# Patient Record
Sex: Male | Born: 1935 | Race: White | Hispanic: No | Marital: Married | Smoking: Never smoker
Health system: Southern US, Community
[De-identification: ages and names within clinical notes are randomized; demographics above are authoritative.]

## PROBLEM LIST (undated history)

## (undated) DIAGNOSIS — E785 Hyperlipidemia, unspecified: Secondary | ICD-10-CM

## (undated) DIAGNOSIS — Z87448 Personal history of other diseases of urinary system: Secondary | ICD-10-CM

## (undated) DIAGNOSIS — C4490 Unspecified malignant neoplasm of skin, unspecified: Secondary | ICD-10-CM

## (undated) DIAGNOSIS — Z87442 Personal history of urinary calculi: Secondary | ICD-10-CM

## (undated) DIAGNOSIS — R42 Dizziness and giddiness: Secondary | ICD-10-CM

## (undated) DIAGNOSIS — I10 Essential (primary) hypertension: Secondary | ICD-10-CM

## (undated) DIAGNOSIS — Z973 Presence of spectacles and contact lenses: Secondary | ICD-10-CM

## (undated) DIAGNOSIS — M47814 Spondylosis without myelopathy or radiculopathy, thoracic region: Secondary | ICD-10-CM

## (undated) DIAGNOSIS — C61 Malignant neoplasm of prostate: Secondary | ICD-10-CM

## (undated) HISTORY — PX: OTHER SURGICAL HISTORY: SHX169

## (undated) HISTORY — DX: Unspecified malignant neoplasm of skin, unspecified: C44.90

---

## 1999-09-27 ENCOUNTER — Ambulatory Visit (HOSPITAL_COMMUNITY): Admission: RE | Admit: 1999-09-27 | Discharge: 1999-09-27 | Payer: Self-pay | Admitting: Urology

## 1999-09-27 ENCOUNTER — Encounter: Payer: Self-pay | Admitting: Urology

## 1999-10-04 ENCOUNTER — Ambulatory Visit (HOSPITAL_COMMUNITY): Admission: RE | Admit: 1999-10-04 | Discharge: 1999-10-04 | Payer: Self-pay | Admitting: Urology

## 1999-10-04 ENCOUNTER — Encounter: Payer: Self-pay | Admitting: Urology

## 1999-10-04 HISTORY — PX: OTHER SURGICAL HISTORY: SHX169

## 2000-02-01 ENCOUNTER — Encounter: Payer: Self-pay | Admitting: *Deleted

## 2000-02-01 ENCOUNTER — Encounter: Admission: RE | Admit: 2000-02-01 | Discharge: 2000-02-01 | Payer: Self-pay | Admitting: *Deleted

## 2001-01-12 ENCOUNTER — Encounter: Payer: Self-pay | Admitting: Urology

## 2001-01-12 ENCOUNTER — Encounter: Payer: Self-pay | Admitting: Emergency Medicine

## 2001-01-12 ENCOUNTER — Ambulatory Visit (HOSPITAL_COMMUNITY): Admission: RE | Admit: 2001-01-12 | Discharge: 2001-01-12 | Payer: Self-pay | Admitting: Urology

## 2001-01-18 ENCOUNTER — Encounter: Payer: Self-pay | Admitting: *Deleted

## 2001-01-18 ENCOUNTER — Ambulatory Visit (HOSPITAL_COMMUNITY): Admission: RE | Admit: 2001-01-18 | Discharge: 2001-01-18 | Payer: Self-pay | Admitting: Urology

## 2001-01-18 HISTORY — PX: OTHER SURGICAL HISTORY: SHX169

## 2001-10-13 ENCOUNTER — Ambulatory Visit (HOSPITAL_COMMUNITY): Admission: RE | Admit: 2001-10-13 | Discharge: 2001-10-13 | Payer: Self-pay | Admitting: Urology

## 2007-04-10 ENCOUNTER — Ambulatory Visit (HOSPITAL_BASED_OUTPATIENT_CLINIC_OR_DEPARTMENT_OTHER): Admission: RE | Admit: 2007-04-10 | Discharge: 2007-04-10 | Payer: Self-pay | Admitting: Urology

## 2007-09-21 ENCOUNTER — Encounter: Admission: RE | Admit: 2007-09-21 | Discharge: 2007-09-21 | Payer: Self-pay | Admitting: Internal Medicine

## 2007-10-28 ENCOUNTER — Ambulatory Visit (HOSPITAL_BASED_OUTPATIENT_CLINIC_OR_DEPARTMENT_OTHER): Admission: RE | Admit: 2007-10-28 | Discharge: 2007-10-28 | Payer: Self-pay | Admitting: Urology

## 2007-10-28 HISTORY — PX: OTHER SURGICAL HISTORY: SHX169

## 2009-01-24 IMAGING — CR DG ABDOMEN 1V
1 series · 1 of 1 positions shown · non-contrast
Comparison: 04/10/2007

CLINICAL DATA: Distal right ureteral stone.  Preop.

ABDOMEN - 1 VIEW

[t abdomen supine]
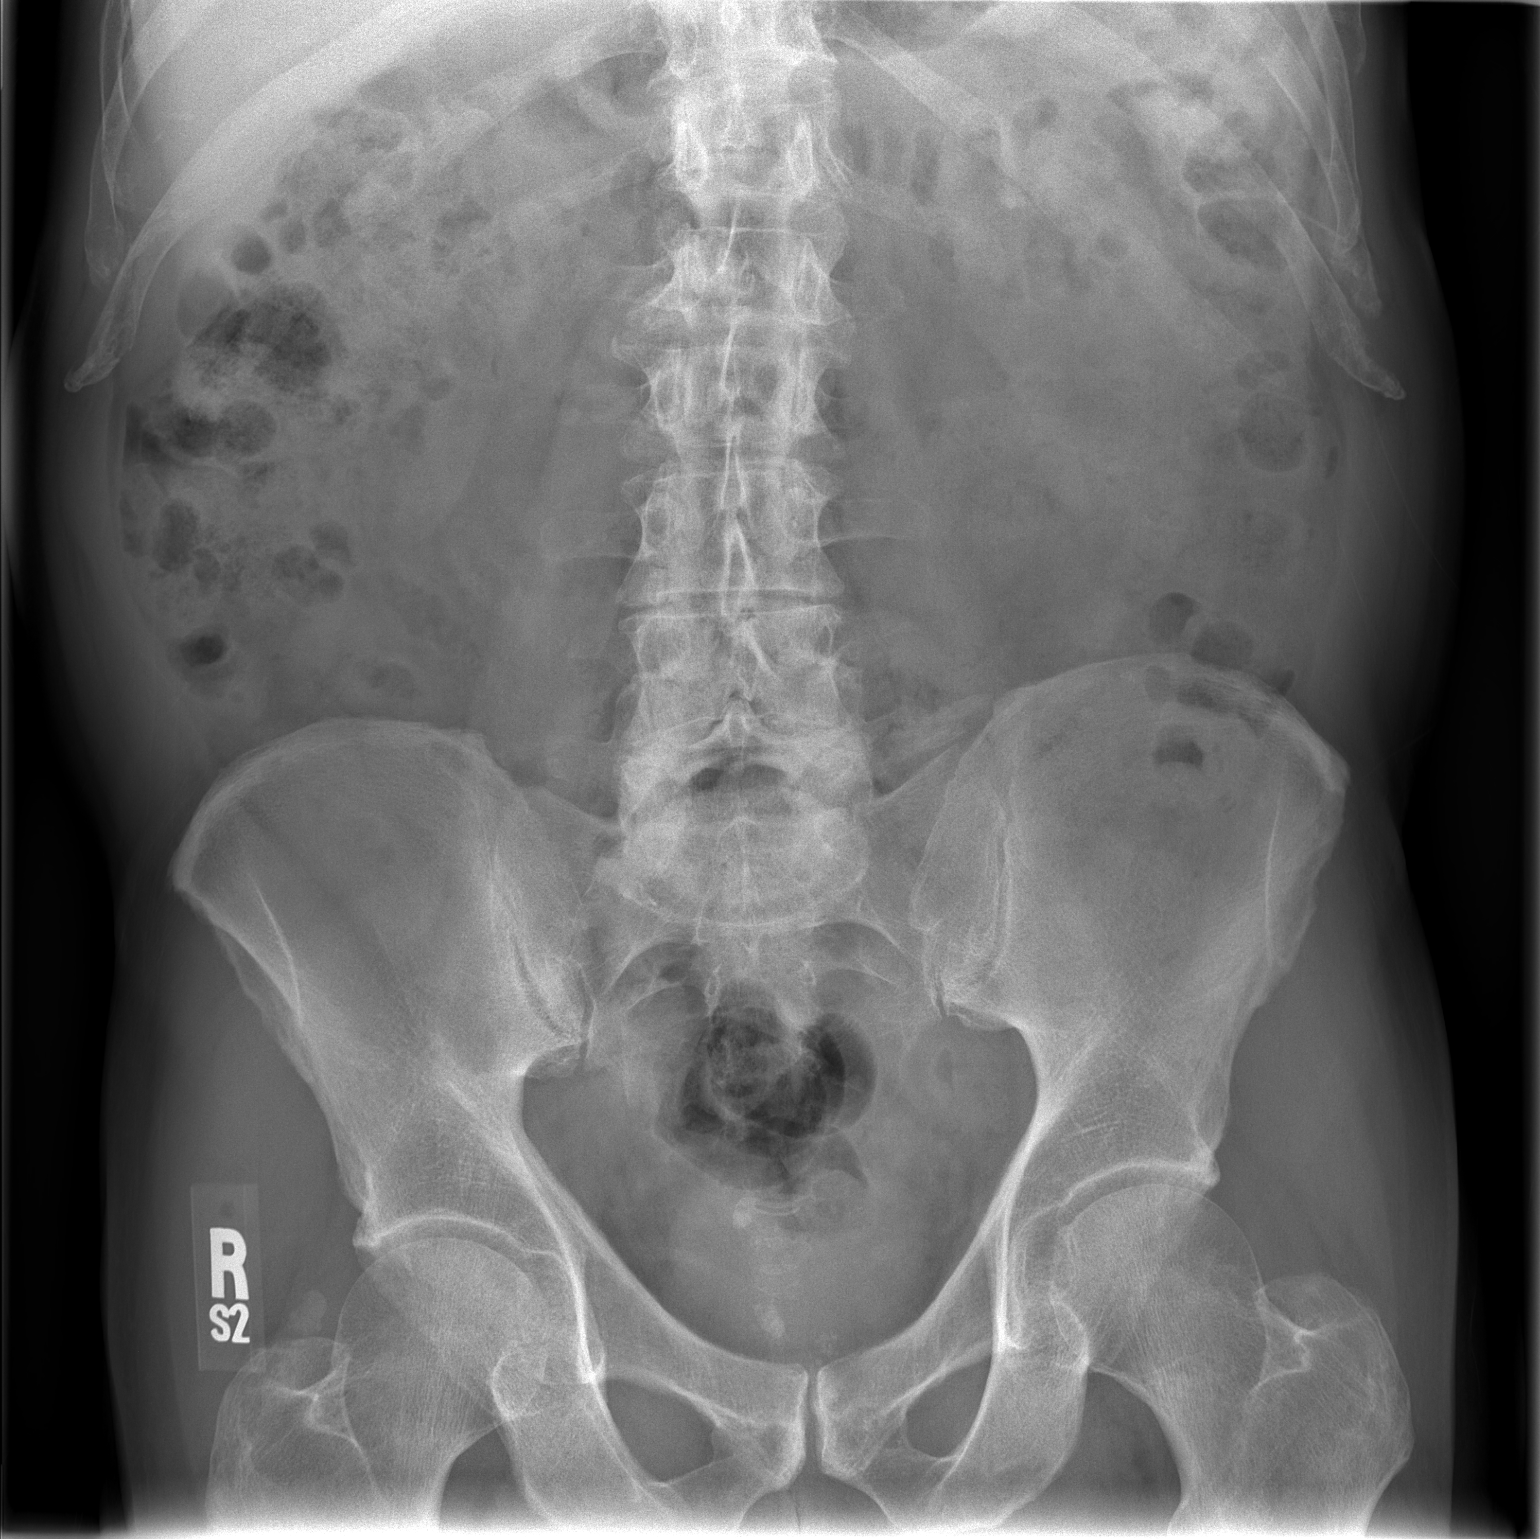

[1 of 1 positions shown; findings below may reference images not displayed]

FINDINGS: Calcifications project in the midline in the lower
pelvis, possibly vascular or prostatic.  No definite ureteral
calculi seen.  Previously noted calcifications over the kidneys not
as well visualized with overlying stool and bowel gas.  A
nonobstructive bowel gas pattern.
IMPRESSION: No definite ureteral calculi.  Calcifications near the midline of
the pelvis presumably are vascular or prostatic.  Recommend
clinical correlation.

## 2010-07-31 NOTE — Op Note (Signed)
Colin Lewis, Colin Lewis                   ACCOUNT NO.:  [id number]   MEDICAL RECORD NO.:  [id number]          PATIENT TYPE:  AMB   LOCATION:  [facility]                         FACILITY:  [facility]   PHYSICIAN:  Ronald L. Earlene Plater, M.D.  DATE OF BIRTH:  Jul 09, 1932   DATE OF PROCEDURE:  04/10/2007  DATE OF DISCHARGE:                               OPERATIVE REPORT   DIAGNOSIS:  Left ureterolithiasis, persistent.   OPERATIVE PROCEDURES:  1. Cystourethroscopy.  2. Left ureteroscopy with holmium laser lithotripsy.  3. Basket stone extraction.  4. Placement of left double-J stent.   SURGEON:  Lucrezia Starch. Earlene Plater, M.D.   ANESTHESIA:  LMA.   BLOOD LOSS:  Negligible.   TUBES:  26 cm 7-French contour double pigtail stent.   COMPLICATIONS:  None.   INDICATION FOR PROCEDURE:  Colin Lewis is a very nice 77 year old white male  who presented with left flank pain and some left groin pain, some nausea  and vomiting.  He was subsequently on CT found to have an approximately  5-6 mm left midureteral stone.  He has been firmed and most recently his  last pain with approximately 2 weeks ago but the stone has been  persistent and after understanding risks, benefits and alternatives, he  has elected to proceed with removal.   PROCEDURE IN DETAIL:  The patient was placed in supine position.  After  proper LMA anesthesia, he was placed in the dorsal lithotomy position  and prepped and draped with Betadine in a sterile fashion.  Cystourethroscopy was performed with a 22.5-French Olympus panendoscope.  There was noted be mild trilobar hypertrophy.  The bladder was smooth-  walled and efflux of urine was noted from the right ureteral orifice and  the left ureteral orifice with normal location.  No other lesions were  noted.  Both the 12 and 70-degree lenses were utilized.  Under  fluoroscopic guidance a 0.038-French sensor wire was placed in the left  renal pelvis past the stone, which was located at the pelvic brim, and  the distal ureter was dilated with the inner dilating sheath of the  ureteral access sheath.  Ureteroscopy was then performed with the short  thin Wolf ureteroscope, semi-flexible.  The stone was visualized and  utilizing the 365-micron holmium laser fiber on a setting of 0.5 and  repetition rate of 5, the stone was broken into multiple small fragments  and each fragment was grasped and removed with a nitinol basket.  Reinspection of the lower two-thirds ureter revealed there were no  significant fragments remaining and no perforations.  The ureteroscope  was visually removed and then under fluoroscopic guidance, a 26-cm 7-  [place] contour double pigtail stent was placed without a string and  noted be in good position within the left renal pelvis and within the  bladder.  The bladder was drained.  There was a very small bleeder on  the 6 o'clock position of the  prostatic urethra.  This was Bovie cauterized with the Bugbee  coagulation cautery.  No other lesions were noted to be present.  The  stent  was noted to be in good location within the bladder and within the  renal pelvis.  The bladder was drained, the panendoscope was removed.  The stones will be given to patient and submitted for stone analysis.      Ronald L. Earlene Plater, M.D.  Electronically Signed     RLD/MEDQ  D:  04/10/2007  T:  04/10/2007  Job:  [id number]

## 2010-07-31 NOTE — Op Note (Signed)
NAMESABIR, CHARTERS                   ACCOUNT NO.:  [id number]   MEDICAL RECORD NO.:  [id number]          PATIENT TYPE:  AMB   LOCATION:  [facility]                         FACILITY:  [facility]   PHYSICIAN:  Ronald L. Earlene Plater, M.D.  DATE OF BIRTH:  Mar 01, 1933   DATE OF PROCEDURE:  10/28/2007  DATE OF DISCHARGE:                               OPERATIVE REPORT   PREOPERATIVE DIAGNOSIS:  Right ureterolithiasis with hydronephrosis.   OPERATIVE PROCEDURE:  Cystourethroscopy, right ureteroscopy, Holmium  laser lithotripsy and placement of right double-J stent and stone basket  extraction.   SURGEON:  Laurin Coder, MD   ANESTHESIA:  LMA.   BLOOD LOSS:  Negligible.   TUBES:  24 cm 7 [place] contoured double pigtail stent without a string.   COMPLICATIONS:  None.   INDICATIONS FOR PROCEDURE:  Mr. Preast is a very nice 77 year old white  male who presented in mid July with right flank pain, nausea and  vomiting.  On KUB he was subsequently found to have a large right distal  ureteral stone.  He is averted, although he has not had much pain he has  had occasional pain episodes and the stone has really not progressed.  KUB today reveals that it is still present in the distal ureter.  After  understanding risks, benefits and alternatives he has elected to proceed  with the above procedure.   PROCEDURE IN DETAIL:  The patient was placed in supine position and  after proper LMA anesthesia was placed in the dorsal lithotomy position  and prepped and draped with Betadine in sterile fashion.  Cystourethroscopy was performed with a 22.5 French Olympus panendoscope  utilizing 12 and 70 degree lenses.  The bladder was carefully inspected.  He had moderate trilobar hypertrophy and grade 1 trabeculation of the  bladder.  The left ureteral orifice was normal in location.  Efflux of  clear urine was noted from that.  However, the right ureteral orifice  was quite inflamed, swollen and it appeared that the stone was  impacted  in the intramural ureter.  Under direct vision a 0.038 French sensor  wire was placed past the stone into the right renal pelvis.  The distal  ureter was dilated with the inner dilating sheath of a ureteral access  catheter and ureteroscopy was performed with a short thin semi flexible  ureteroscope.  The stone was visualized and noted to be quite large and  smooth and utilizing the 365 micron laser fiber on a setting of 0.5 and  a repetition rate of 5 the stone was serially fragmented into multiple  small fragments and each fragment was grasped with a Nitinol basket and  removed.  Reinspection of the lower third ureter revealed there were no  perforations.  No significant fragments.  There was significant  hydronephrosis noted above where the stone had been impacted.  The  fragments will be submitted for stone analysis and under fluoroscopic  guidance a 24 cm 7  French contoured double pigtail stent was placed and noted to be in good  position within the right renal pelvis  and within the bladder.  The  bladder was drained, the panendoscope was removed.  Stent localization  was confirmed fluoroscopically and the patient was taken to the recovery  room stable.      Ronald L. Earlene Plater, M.D.  Electronically Signed     RLD/MEDQ  D:  10/28/2007  T:  10/28/2007  Job:  [id number]

## 2010-08-03 NOTE — Op Note (Signed)
Colin, Lewis                            ACCOUNT NO.:  [id number]   MEDICAL RECORD NO.:  [id number]                   PATIENT TYPE:  AMB   LOCATION:  DAY                                  FACILITY:  [facility]   PHYSICIAN:  Ronald L. Earlene Plater, M.D.               DATE OF BIRTH:  [birth year]   DATE OF PROCEDURE:  10/13/2001  DATE OF DISCHARGE:                                 OPERATIVE REPORT   PREOPERATIVE DIAGNOSES:  Left ureteral calculus.   POSTOPERATIVE DIAGNOSES:  Left ureteral calculus.   PROCEDURE:  Cystoscopy.  Left ureteroscopic stone extraction.   SURGEON:  Lucrezia Starch. Earlene Plater, M.D.   ASSISTANT:  Heloise Purpura, M.D.   ANESTHESIA:  General.   COMPLICATIONS:  None.   INDICATIONS FOR PROCEDURE:  Colin Lewis is a 77 year old white male with  approximately a three-month history of a known left ureteral calculus.  After conservative management, attempt at trying to catch the stone, he has  been unable to do so.  He has continued to have some intermittent flank pain  with partial obstruction due to this calculus.  After discussing options,  the patient elected to proceed with ureteroscopic stone removal.  Potential  risks and benefits of this procedure were explained to the patient and he  consented.   DESCRIPTION OF PROCEDURE:  The patient was taken to the operating room and a  general anesthesia was administered.  The patient was placed in the dorsal  lithotomy position, administered preoperative antibiotics, and prepped and  draped in the usual sterile fashion.  Next, standard cystourethroscopy was  performed with the 12 and 70 degree lens.  This revealed a normal anterior  urethra.  The posterior urethra demonstrated a high bladder neck with a  mildly enlarged middle lobe of the prostate.  Examination of the bladder  revealed the ureteral orifices to be in their normal anatomic position  bilaterally and effluxing clear urine.  Inspection of the remainder of the  bladder revealed  no evidence of any tumors, stone, diverticula, or other  mucosal pathology.  Attention was then turned to the left ureteral orifice.  A 0.038 guide wire was passed up the left ureteral orifice into the left  renal pelvis under fluoroscopic guidance without difficulty.  The cystoscope  was then removed and replaced with the short, semirigid ureteroscope.  The  left ureteral orifice was able to be cannulated without difficulty and the  ureteroscope was passed up into the left midureter where a calculus was  identified.  This appeared to be small enough to be removed and therefore a  Nitinol basket was placed through the scope and the stone was able to be  grasped and removed from the patient.  Reinspection of the ureter revealed  no evidence of any further calculi or damage to the ureter.  Of note, the  stone was partially imbedded in the ureteral mucosa.  The cystoscope was  then replaced in the bladder and the  bladder was drained.  The patient appeared to tolerate this procedure well  and without complications.  He was able to be awakened and transferred to  the recovery room in satisfactory condition.  Please note that Dr. Earlene Plater was  present and participated in this entire procedure.     Heloise Purpura, M.D.                       Ronald L. Earlene Plater, M.D.    Elige Ko  D:  10/13/2001  T:  10/15/2001  Job:  [id number]

## 2010-08-03 NOTE — Op Note (Signed)
[place]  Patient:    Colin Lewis, Colin Lewis                          MRN: [id number] Proc. Date: 10/04/99 Adm. Date:  [id number] Disc. Date: [id number] Attending:  Nelma Rothman Iii                           Operative Report  PREOPERATIVE DIAGNOSES: 1.  Right ureterolithiasis with obstruction. 2.  Meatal stenosis.  POSTOPERATIVE DIAGNOSES: 1.  Right ureterolithiasis with obstruction. 2.  Meatal stenosis.  OPERATIVE PROCEDURE:  Dilatation of meatal stenosis with Sissy Hoff sounds, cystourethroscopy, left retrograde ureteral pyelogram, left ureteroscopy with Candella laser lithotripsy and stone basket extraction and placement of left double J stent.  SURGEON:  Lucrezia Starch. Ovidio Hanger, M.D.  ANESTHESIA:  General, laryngeal airways.  BLOOD LOSS:  Negligible.  TUBES:  26 cm 6 French Bard double pigtail stent.  COMPLICATIONS:  None.  INDICATIONS FOR PROCEDURE:  Mr. Roethler is a very nice 77 year old white male who presented with left flank pain, nausea and vomiting.  IVP revealed what appeared to be a 7 mm left upper ureteral calculus which subsequently moved to the left lower ureter tube.  He has had intermittent pain.  After understanding risks, benefits and alternatives, it was elected to proceed with the above procedure.  PROCEDURE IN DETAIL:  The patient was placed in supine position.  After proper general laryngeal airway anesthesia, he was placed in the dorsal lithotomy position, prepped and draped with Betadine in sterile fashion.  The meatus was noted to be quite tight and was dilated to 24 [place] with [employer] sounds, and cystourethroscopy was performed with a 22.5 [employer]. Utilizing 12 and 70 degree lenses, the bladder was carefully inspected and noted to have moderate trilobar enlargement of the prostate with grade 1 trabeculation.  Efflux of urine was noted from the right ureteral orifice but very little from the  left.  Under fluoroscopic guidance a .038 French Teflon coated guide wire was placed in the left renal pelvis and the lower ureter was balloon dilated with a 4 cm, 5 mm balloon dilator to 10 atmospheres of pressure for three minutes.  It was noted to have a tight ureteral orifice and it was felt that manipulation with the scope through that was probably not indicated.  The balloon was then removed and dye was injected and the stone was visualized and there was noted to be mild obstruction.  Utilizing the 6 French [employer] semi-rigid ureteroscope, the stone was visualized and broken into fragments with the Candella laser.  Each fragment was serially basketed with a Segura basket and will be submitted for stone analysis. Reinspection revealed no significant fragments remaining, and again, dye was injected and there were no perforations noted to be present.  The ureteroscope was visually removed. Under fluoroscopic guidance a 26 cm 6 French Bard double pigtail stent was placed with a pullout string and noted to be in good position within the bladder and within the left renal pelvis.  The bladder was drained.  The panendoscope was removed.  The string was taped to the penis and the patient was taken to the recovery room stable. DD:  10/04/99 TD:  10/06/99 Job: [id number]

## 2010-08-03 NOTE — Op Note (Signed)
[facility]  Patient:    Colin Lewis, Colin Lewis Visit Number: [id number] MRN: [id number]          Service Type: DSU Location: DAY Attending Physician:  Katherine Roan Proc. Date: 01/18/01 Admit Date:  01/18/2001   CC:         Windy Fast L. Ovidio Hanger, M.D.   Operative Report  PREOPERATIVE DIAGNOSIS:  Right proximal ureteral stone with obstruction.  POSTOPERATIVE DIAGNOSIS:  Right proximal ureteral stone with obstruction with mild urinary extravasation same area.  PROCEDURE:  Cystoscopy, right retrograde pyelogram, insertion of right ureteral stent.  ANESTHESIA:  General.  SURGEON:  Rozanna Boer., M.D.  BRIEF HISTORY:  This 77 year old patient underwent right lithotripsy on 01/12/01, of a proximal right ureteral stone.  He had a lot of pain and went to the office on 01/16/01 for a shot of Toradol and had to go back to the emergency room again for the same.  CT scan showed a 7 mm stone in the proximal right ureter with high-grade hydronephrosis, and he is admitted now for cystoscopy stent placement.  The only other surgery he had was ureteroscopy 07/01, which was successful.  He takes only pain medicine.  He just finished his Cipro today.  DESCRIPTION OF PROCEDURE:  The patient was placed on the operating room table in the dorsal lithotomy position after satisfactory induction of general endotracheal anesthesia, was prepped and draped with Betadine in the usual sterile fashion.  He was given Ancef and gentamicin IV preoperatively.  No anterior stricture seen.  Some mild hyperplasia of the prostate noted.  The bladder was entered, and the right ureteral orifice was somewhat small, and I had to use a guidewire through the open-ended catheter to be able to catheterize the orifice.  Once this was done, a retrograde was done which demonstrated a proximal filling defect in the region of the previous stone with moderate hydronephrosis and  mild urinary extravasation.  I was able to pass the catheter up to the stone but could not get the guidewire passed, and I then had to use a Glidewire to be able to negotiate beyond the stone and then passed the open-ended catheter beyond that into the renal pelvis.  He had a hydronephrotic drip of slightly bloody urine obtained with this maneuver. Through the open-ended catheter, I then passed the floppy tip guidewire and then removed the open-ended catheter and then over the guidewire passed a 24 cm 6 French ureteral stent without difficulty in good position.  The slightly bloody urine could be seen flowing through the end of the stent when it was in the bladder.  The bladder was then drained, the scope removed, and the patient taken to the recovery room in good condition.  He will be later discharged as an outpatient.  He will be maintained on his Cipro, given some more pain medicine and will follow up with Dr. Earlene Plater next week as scheduled. Attending Physician:  Katherine Roan DD:  01/18/01 TD:  01/19/01 Job: [id number]

## 2010-12-06 LAB — CBC
RBC: 4.6
WBC: 8.4

## 2010-12-06 LAB — URINALYSIS, ROUTINE W REFLEX MICROSCOPIC
Glucose, UA: NEGATIVE
Protein, ur: NEGATIVE
Urobilinogen, UA: 1

## 2010-12-06 LAB — DIFFERENTIAL
Lymphocytes Relative: 23
Lymphs Abs: 1.9
Monocytes Relative: 9
Neutro Abs: 4.1
Neutrophils Relative %: 49

## 2010-12-06 LAB — BASIC METABOLIC PANEL
Calcium: 8.8
Chloride: 103
Creatinine, Ser: 1.55 — ABNORMAL HIGH
GFR calc Af Amer: 54 — ABNORMAL LOW

## 2010-12-14 LAB — BASIC METABOLIC PANEL
CO2: 27
Glucose, Bld: 102 — ABNORMAL HIGH
Potassium: 4.2
Sodium: 141

## 2010-12-14 LAB — CBC
HCT: 39.8
Hemoglobin: 13.1
MCHC: 32.9
RDW: 13.4

## 2011-02-21 ENCOUNTER — Encounter (INDEPENDENT_AMBULATORY_CARE_PROVIDER_SITE_OTHER): Payer: Medicare Other | Admitting: Ophthalmology

## 2011-02-21 DIAGNOSIS — D3130 Benign neoplasm of unspecified choroid: Secondary | ICD-10-CM

## 2011-02-21 DIAGNOSIS — H43819 Vitreous degeneration, unspecified eye: Secondary | ICD-10-CM

## 2011-02-21 DIAGNOSIS — H2510 Age-related nuclear cataract, unspecified eye: Secondary | ICD-10-CM

## 2011-03-19 HISTORY — PX: CATARACT EXTRACTION W/ INTRAOCULAR LENS  IMPLANT, BILATERAL: SHX1307

## 2012-02-20 ENCOUNTER — Ambulatory Visit
Admission: RE | Admit: 2012-02-20 | Discharge: 2012-02-20 | Disposition: A | Discharge status: Home / Self Care | Payer: Medicare Other | Source: Ambulatory Visit | Attending: Internal Medicine | Admitting: Internal Medicine

## 2012-02-20 ENCOUNTER — Ambulatory Visit
Admission: RE | Admit: 2012-02-20 | Discharge: 2012-02-20 | Disposition: A | Discharge status: Home / Self Care | Payer: 59 | Source: Ambulatory Visit | Attending: Internal Medicine | Admitting: Internal Medicine

## 2012-02-20 ENCOUNTER — Other Ambulatory Visit: Payer: Self-pay | Admitting: Internal Medicine

## 2012-02-20 DIAGNOSIS — R109 Unspecified abdominal pain: Secondary | ICD-10-CM

## 2012-02-20 DIAGNOSIS — R079 Chest pain, unspecified: Secondary | ICD-10-CM

## 2012-02-24 ENCOUNTER — Ambulatory Visit (INDEPENDENT_AMBULATORY_CARE_PROVIDER_SITE_OTHER): Payer: Medicare Other | Admitting: Ophthalmology

## 2012-02-24 DIAGNOSIS — H43819 Vitreous degeneration, unspecified eye: Secondary | ICD-10-CM

## 2012-02-24 DIAGNOSIS — D3130 Benign neoplasm of unspecified choroid: Secondary | ICD-10-CM

## 2012-02-24 DIAGNOSIS — H2510 Age-related nuclear cataract, unspecified eye: Secondary | ICD-10-CM

## 2012-05-07 ENCOUNTER — Other Ambulatory Visit: Payer: Self-pay | Admitting: Urology

## 2012-05-07 NOTE — Pre-Procedure Instructions (Signed)
Asked to bring blue folder the day of the procedure,insurance card,I.D. driver's license,wear comfortable clothing and have a driver for the day. Asked not to take Advil,Motrin,Ibuprofen,Aleve or any NSAIDS, Aspirin, or Toradol for 72 hours prior to procedure,  No vitamins or herbal medications 7 days prior to procedure. Will stop Asprin per policy. Instructed to take laxative per doctor's office instructions and eat a light dinner the evening before procedure.   To arrive at 0800  for lithotripsy procedure.

## 2012-05-14 ENCOUNTER — Encounter (HOSPITAL_COMMUNITY): Payer: Self-pay | Admitting: *Deleted

## 2012-05-15 ENCOUNTER — Encounter (HOSPITAL_COMMUNITY): Payer: Self-pay | Admitting: Pharmacy Technician

## 2012-05-18 ENCOUNTER — Encounter (HOSPITAL_COMMUNITY): Payer: Self-pay | Admitting: General Practice

## 2012-05-18 ENCOUNTER — Ambulatory Visit (HOSPITAL_COMMUNITY)
Admission: RE | Admit: 2012-05-18 | Discharge: 2012-05-18 | Disposition: A | Discharge status: Home / Self Care | Payer: Medicare Other | Source: Ambulatory Visit | Attending: Urology | Admitting: Urology

## 2012-05-18 ENCOUNTER — Encounter (HOSPITAL_COMMUNITY)
Admission: RE | Disposition: A | Discharge status: Home / Self Care | Payer: Self-pay | Source: Ambulatory Visit | Attending: Urology

## 2012-05-18 ENCOUNTER — Ambulatory Visit (HOSPITAL_COMMUNITY): Payer: Medicare Other

## 2012-05-18 DIAGNOSIS — I10 Essential (primary) hypertension: Secondary | ICD-10-CM | POA: Insufficient documentation

## 2012-05-18 DIAGNOSIS — N200 Calculus of kidney: Secondary | ICD-10-CM | POA: Insufficient documentation

## 2012-05-18 DIAGNOSIS — Z79899 Other long term (current) drug therapy: Secondary | ICD-10-CM | POA: Insufficient documentation

## 2012-05-18 DIAGNOSIS — N320 Bladder-neck obstruction: Secondary | ICD-10-CM | POA: Insufficient documentation

## 2012-05-18 DIAGNOSIS — R972 Elevated prostate specific antigen [PSA]: Secondary | ICD-10-CM | POA: Insufficient documentation

## 2012-05-18 DIAGNOSIS — E7800 Pure hypercholesterolemia, unspecified: Secondary | ICD-10-CM | POA: Insufficient documentation

## 2012-05-18 HISTORY — PX: EXTRACORPOREAL SHOCK WAVE LITHOTRIPSY: SHX1557

## 2012-05-18 HISTORY — DX: Essential (primary) hypertension: I10

## 2012-05-18 SURGERY — LITHOTRIPSY, ESWL
Anesthesia: LOCAL | Laterality: Left

## 2012-05-18 MED ORDER — DIAZEPAM 5 MG PO TABS
10.0000 mg | ORAL_TABLET | ORAL | Status: AC
  Administered 2012-05-18: 10 mg via ORAL
  Filled 2012-05-18: qty 2

## 2012-05-18 MED ORDER — CIPROFLOXACIN HCL 500 MG PO TABS
500.0000 mg | ORAL_TABLET | ORAL | Status: AC
  Administered 2012-05-18: 500 mg via ORAL
  Filled 2012-05-18: qty 1

## 2012-05-18 MED ORDER — DIPHENHYDRAMINE HCL 25 MG PO CAPS
25.0000 mg | ORAL_CAPSULE | ORAL | Status: AC
  Administered 2012-05-18: 25 mg via ORAL
  Filled 2012-05-18: qty 1

## 2012-05-18 MED ORDER — DEXTROSE-NACL 5-0.45 % IV SOLN
INTRAVENOUS | Status: DC
  Administered 2012-05-18: 09:00:00 via INTRAVENOUS

## 2012-05-18 NOTE — H&P (Signed)
Chief Complaint  cc:  Ralene Ok, MD   Reason For Visit  Kidney stone & elevated PSA   Active Problems Problems  1. 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 Nephrolithiasis Of The Left Kidney 592.0 3. Prostate Hard Area Or Nodule On The Left 4. PSA,Elevated 790.93  History of Present Illness           79 yo male, last seen 11/03/07, referred back by Dr. Ludwig Clarks for further evaluation of an elevated PSA and a Lt renal stone. No family hx of prostate cancer. He is retired Psychiatrist. IPSS=13/7.   02/20/12  KUB - 1cm Lt upper pole renal stone   Past Medical History Problems  1. History of  Abdominal Pain In The Left Lower Belly (LLQ) 789.04 2. History of  Constipation 564.00 3. History of  Flank Pain Right 4. History of  Hydronephrosis On The Left 591 5. History of  Hypercholesterolemia 272.0 6. History of  Nausea (Symptom) 787.02 7. History of  Nephrolithiasis V13.01 8. History of  Ureteral Stone Left 592.1 9. History of  Ureteral Stone 592.1  Surgical History Problems  1. History of  Cystoscopy With Insertion Of Ureteral Stent Left 2. History of  Cystoscopy With Insertion Of Ureteral Stent Right 3. History of  Cystoscopy With Ureteroscopy With Lithotripsy 4. History of  Cystoscopy With Ureteroscopy With Lithotripsy  Current Meds 1. Flomax 0.4 MG CP24; TAKE 1 CAPSULE Bedtime; Therapy: 17Jul2009 to (Evaluate:21Sep2009);  Last Rx:23Jul2009 2. Hydrocodone-Acetaminophen 10-650 MG Oral Tablet; TAKE 1 TO 2 TABLETS EVERY 4 TO 6  HOURS AS NEEDED FOR PAIN; Therapy: 15Jul2009 to (Evaluate:19Jul2009); Last Rx:15Jul2009 3. Promethazine HCl 12.5 MG Oral Tablet; TAKE 1 TABLET EVERY 6 HOURS AS NEEDED FOR  NAUSEA; Therapy: 15Jul2009 to (Evaluate:20Jul2009); Last Rx:15Jul2009 4. Simvastatin 80 MG Oral Tablet; Therapy: (Recorded:05Jan2009) to  Allergies Medication  1. No Known Drug Allergies  Family History Problems  1. Paternal history of  Death In The Family Father age 67;  infection 2. Maternal history of  Death In The Family Mother age 68; rare blood disease 3. Family history of  Family Health Status Number Of Children 2 sons; 1 daughter  Social History Problems    Caffeine Use 2 a day   Marital History - Currently Married   Occupation: retired Denied    Alcohol Use   Tobacco Use  Review of Systems Genitourinary, constitutional, skin, eye, otolaryngeal, hematologic/lymphatic, cardiovascular, pulmonary, endocrine, musculoskeletal, gastrointestinal, neurological and psychiatric system(s) were reviewed and pertinent findings if present are noted.  Genitourinary: urinary frequency, feelings of urinary urgency, nocturia, difficulty starting the urinary stream, weak urinary stream, urinary stream starts and stops, incomplete emptying of bladder and initiating urination requires straining, but no dysuria, no incontinence, no post-void dribbling, no hematuria, no urethral discharge, no erectile dysfunction and no penile pain.  Gastrointestinal: no diarrhea and no constipation.  ENT: sinus problems.  Musculoskeletal: back pain and joint pain.    Vitals Vital Signs [Data Includes: Last 1 Day]  22Jan2014 11:16AM  BMI Calculated: 27.66 BSA Calculated: 1.83 Height: 5 ft 5 in Weight: 166 lb  Blood Pressure: 164 / 80 Temperature: 97.8 F Heart Rate: 66  Physical Exam Constitutional: Well nourished and well developed . No acute distress.  ENT:. The ears and nose are normal in appearance.  Neck: The appearance of the neck is normal and no neck mass is present.  Pulmonary: No respiratory distress and normal respiratory rhythm and effort.  Cardiovascular: Heart rate and rhythm are normal . No  peripheral edema.  Abdomen: The abdomen is soft and nontender. No masses are palpated. No CVA tenderness. No hernias are palpable. No hepatosplenomegaly noted.  Rectal: Rectal exam demonstrates normal sphincter tone and the anus is normal on inspection. Estimated  prostate size is 3+. The prostate has a palpable nodule involving the mid aspect, entire left lobe of the prostate which appears to be confined within the prostate capsule, is not indurated, is not tender and is not fluctuant. The left seminal vesicle is nonpalpable. The right seminal vesicle is nonpalpable. The perineum is normal on inspection.  Genitourinary: Examination of the penis demonstrates no discharge, no masses, no lesions and a normal meatus. The penis is circumcised. The scrotum is without lesions. The right epididymis is palpably normal and non-tender. The left epididymis is palpably normal and non-tender. The right testis is non-tender and without masses. The left testis is non-tender and without masses.  Lymphatics: The femoral and inguinal nodes are not enlarged or tender.  Skin: Normal skin turgor, no visible rash and no visible skin lesions.  Neuro/Psych:. Mood and affect are appropriate.    Results/Data  08 Apr 2012 11:03 AM   UA With REFLEX       COLOR YELLOW       APPEARANCE CLEAR       SPECIFIC GRAVITY 1.010       pH 6.0       GLUCOSE NEG       BILIRUBIN NEG       KETONE NEG       BLOOD TRACE       PROTEIN NEG       UROBILINOGEN 0.2       NITRITE NEG       LEUKOCYTE ESTERASE TRACE       SQUAMOUS EPITHELIAL/HPF RARE       WBC 0-2       CRYSTALS NONE SEEN       CASTS NONE SEEN       RBC 0-2       BACTERIA RARE      Assessment Assessed  1. Nephrolithiasis Of The Left Kidney 592.0 2. Prostate Hard Area Or Nodule On The Left 3. PSA,Elevated 790.93 4. 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 Review of outside KUB: 1cm LUP stone. Needs CT stone protocol for conformation prior to lithotripsy.  2. PE with L prostate nodule, confined to prostate. Elevated psa. Needs pbx.  3. Bladder outlet obstruction with IPSS=13.   Plan  Nephrolithiasis Of The Left Kidney (592.0)  1. AU CT-STONE PROTOCOL  Requested for: 22Jan2014 PMH: Nausea (Symptom) (787.02)  2. Promethazine  HCl 12.5 MG Oral Tablet; TAKE 1 TABLET EVERY 6 HOURS AS NEEDED FOR  NAUSEA; Therapy: 15Jul2009 to 22Jan2014; Last Rx:15Jul2009; Status: DISCONTINUED PSA,Elevated (790.93)  3. PROSTATE BIOPSY ULTRASOUND  Requested for: 22Jan2014 4. Follow-up After Test Office  Follow-up  Requested for: 22Jan2014   1. RTC for prostate biopsy 2. CT stone protocol 3. F/u after tests to discuss bx results and CT stone protocol results. He does not think his IPSS warrents Rx at this time.    UA With REFLEX  Status: Resulted - Requires Verification  Done: 01Jan0001 12:00AM Ordered Today; For: Health Maintenance (V70.0); Ordered By: [name]  Due: 24Jan2014 Marked Important; Last Updated By: Blinda Leatherwood   Signatures Electronically signed by : [name], M.D.; Apr 08 2012 12:07PM

## 2012-05-18 NOTE — Interval H&P Note (Signed)
History and Physical Interval Note:  05/18/2012 2:23 PM  Colin Lewis  has presented today for surgery, with the diagnosis of Left Renal Pelvic Stone  The various methods of treatment have been discussed with the patient and family. After consideration of risks, benefits and other options for treatment, the patient has consented to  Procedure(s): EXTRACORPOREAL SHOCK WAVE LITHOTRIPSY (ESWL) (Left) as a surgical intervention .  The patient's history has been reviewed, patient examined, no change in status, stable for surgery.  I have reviewed the patient's chart and labs.  Questions were answered to the patient's satisfaction.     [name] I

## 2013-02-23 ENCOUNTER — Ambulatory Visit (INDEPENDENT_AMBULATORY_CARE_PROVIDER_SITE_OTHER): Payer: Medicare Other | Admitting: Ophthalmology

## 2013-02-23 DIAGNOSIS — D3130 Benign neoplasm of unspecified choroid: Secondary | ICD-10-CM

## 2013-02-23 DIAGNOSIS — H43819 Vitreous degeneration, unspecified eye: Secondary | ICD-10-CM

## 2013-08-02 ENCOUNTER — Other Ambulatory Visit: Payer: Self-pay | Admitting: Urology

## 2013-09-08 ENCOUNTER — Encounter (HOSPITAL_BASED_OUTPATIENT_CLINIC_OR_DEPARTMENT_OTHER): Payer: Self-pay | Admitting: *Deleted

## 2013-09-08 NOTE — Progress Notes (Signed)
NPO AFTER MN. ARRIVE AT 0745. NEEDS ISTAT AND EKG. WILL TAKE COZAAR AND ZOCOR AM DOS W/ SIPS OF WATER.

## 2013-09-13 ENCOUNTER — Encounter (HOSPITAL_BASED_OUTPATIENT_CLINIC_OR_DEPARTMENT_OTHER)
Admission: RE | Disposition: A | Discharge status: Home / Self Care | Payer: Self-pay | Source: Ambulatory Visit | Attending: Urology

## 2013-09-13 ENCOUNTER — Ambulatory Visit (HOSPITAL_BASED_OUTPATIENT_CLINIC_OR_DEPARTMENT_OTHER): Payer: Medicare Other | Admitting: Anesthesiology

## 2013-09-13 ENCOUNTER — Other Ambulatory Visit: Payer: Self-pay

## 2013-09-13 ENCOUNTER — Encounter (HOSPITAL_BASED_OUTPATIENT_CLINIC_OR_DEPARTMENT_OTHER): Payer: Medicare Other | Admitting: Anesthesiology

## 2013-09-13 ENCOUNTER — Encounter (HOSPITAL_BASED_OUTPATIENT_CLINIC_OR_DEPARTMENT_OTHER): Payer: Self-pay | Admitting: *Deleted

## 2013-09-13 ENCOUNTER — Ambulatory Visit (HOSPITAL_BASED_OUTPATIENT_CLINIC_OR_DEPARTMENT_OTHER)
Admission: RE | Admit: 2013-09-13 | Discharge: 2013-09-13 | Disposition: A | Discharge status: Home / Self Care | Payer: Medicare Other | Source: Ambulatory Visit | Attending: Urology | Admitting: Urology

## 2013-09-13 DIAGNOSIS — N320 Bladder-neck obstruction: Secondary | ICD-10-CM | POA: Diagnosis not present

## 2013-09-13 DIAGNOSIS — C61 Malignant neoplasm of prostate: Secondary | ICD-10-CM | POA: Insufficient documentation

## 2013-09-13 DIAGNOSIS — I10 Essential (primary) hypertension: Secondary | ICD-10-CM | POA: Insufficient documentation

## 2013-09-13 DIAGNOSIS — Z7982 Long term (current) use of aspirin: Secondary | ICD-10-CM | POA: Diagnosis not present

## 2013-09-13 DIAGNOSIS — Z79899 Other long term (current) drug therapy: Secondary | ICD-10-CM | POA: Insufficient documentation

## 2013-09-13 DIAGNOSIS — E7800 Pure hypercholesterolemia, unspecified: Secondary | ICD-10-CM | POA: Diagnosis not present

## 2013-09-13 HISTORY — DX: Malignant neoplasm of prostate: C61

## 2013-09-13 HISTORY — DX: Presence of spectacles and contact lenses: Z97.3

## 2013-09-13 HISTORY — PX: CRYOABLATION: SHX1415

## 2013-09-13 HISTORY — DX: Personal history of other diseases of urinary system: Z87.448

## 2013-09-13 HISTORY — DX: Personal history of urinary calculi: Z87.442

## 2013-09-13 HISTORY — DX: Spondylosis without myelopathy or radiculopathy, thoracic region: M47.814

## 2013-09-13 HISTORY — DX: Hyperlipidemia, unspecified: E78.5

## 2013-09-13 LAB — POCT I-STAT 4, (NA,K, GLUC, HGB,HCT)
GLUCOSE: 89 mg/dL (ref 70–99)
HEMATOCRIT: 43 % (ref 39.0–52.0)
HEMOGLOBIN: 14.6 g/dL (ref 13.0–17.0)
POTASSIUM: 3.8 meq/L (ref 3.7–5.3)
SODIUM: 141 meq/L (ref 137–147)

## 2013-09-13 SURGERY — CRYOABLATION, PROSTATE
Anesthesia: General | Site: Prostate

## 2013-09-13 MED ORDER — TRAMADOL-ACETAMINOPHEN 37.5-325 MG PO TABS: 1.0000 | ORAL_TABLET | Freq: Four times a day (QID) | ORAL | Status: DC | PRN

## 2013-09-13 MED ORDER — LACTATED RINGERS IV SOLN
INTRAVENOUS | Status: DC
  Filled 2013-09-13: qty 1000

## 2013-09-13 MED ORDER — URIBEL 118 MG PO CAPS: 1.0000 | ORAL_CAPSULE | Freq: Two times a day (BID) | ORAL | Status: DC | PRN

## 2013-09-13 MED ORDER — BELLADONNA ALKALOIDS-OPIUM 16.2-60 MG RE SUPP
RECTAL | Status: AC
  Filled 2013-09-13: qty 1

## 2013-09-13 MED ORDER — CEFAZOLIN SODIUM-DEXTROSE 2-3 GM-% IV SOLR
2.0000 g | INTRAVENOUS | Status: AC
  Administered 2013-09-13: 2 g via INTRAVENOUS
  Filled 2013-09-13: qty 50

## 2013-09-13 MED ORDER — CIPROFLOXACIN HCL 500 MG PO TABS: 500.0000 mg | ORAL_TABLET | Freq: Two times a day (BID) | ORAL | Status: DC

## 2013-09-13 MED ORDER — ONDANSETRON HCL 4 MG/2ML IJ SOLN
INTRAMUSCULAR | Status: DC | PRN
  Administered 2013-09-13: 4 mg via INTRAVENOUS

## 2013-09-13 MED ORDER — LACTATED RINGERS IV SOLN
INTRAVENOUS | Status: DC | PRN
  Administered 2013-09-13 (×2): via INTRAVENOUS

## 2013-09-13 MED ORDER — LACTATED RINGERS IV SOLN
INTRAVENOUS | Status: DC
  Administered 2013-09-13: 09:00:00 via INTRAVENOUS
  Filled 2013-09-13: qty 1000

## 2013-09-13 MED ORDER — DEXAMETHASONE SODIUM PHOSPHATE 4 MG/ML IJ SOLN
INTRAMUSCULAR | Status: DC | PRN
  Administered 2013-09-13: 10 mg via INTRAVENOUS

## 2013-09-13 MED ORDER — TRAMADOL HCL 50 MG PO TABS
ORAL_TABLET | ORAL | Status: AC
  Filled 2013-09-13: qty 1

## 2013-09-13 MED ORDER — ACETAMINOPHEN 10 MG/ML IV SOLN
INTRAVENOUS | Status: DC | PRN
  Administered 2013-09-13: 1000 mg via INTRAVENOUS

## 2013-09-13 MED ORDER — TRAMADOL HCL 50 MG PO TABS
50.0000 mg | ORAL_TABLET | Freq: Four times a day (QID) | ORAL | Status: DC | PRN
  Administered 2013-09-13: 50 mg via ORAL
  Filled 2013-09-13: qty 1

## 2013-09-13 MED ORDER — TAMSULOSIN HCL 0.4 MG PO CAPS: 0.4000 mg | ORAL_CAPSULE | Freq: Every day | ORAL | Status: DC

## 2013-09-13 MED ORDER — HYDROMORPHONE HCL PF 1 MG/ML IJ SOLN
0.2500 mg | INTRAMUSCULAR | Status: DC | PRN
  Administered 2013-09-13: 0.25 mg via INTRAVENOUS
  Filled 2013-09-13: qty 1

## 2013-09-13 MED ORDER — HYDROMORPHONE HCL PF 1 MG/ML IJ SOLN
INTRAMUSCULAR | Status: AC
  Filled 2013-09-13: qty 1

## 2013-09-13 MED ORDER — KETOROLAC TROMETHAMINE 30 MG/ML IJ SOLN
INTRAMUSCULAR | Status: DC | PRN
  Administered 2013-09-13: 30 mg via INTRAVENOUS

## 2013-09-13 MED ORDER — FENTANYL CITRATE 0.05 MG/ML IJ SOLN
INTRAMUSCULAR | Status: DC | PRN
  Administered 2013-09-13 (×6): 25 ug via INTRAVENOUS

## 2013-09-13 MED ORDER — PROMETHAZINE HCL 25 MG/ML IJ SOLN
6.2500 mg | INTRAMUSCULAR | Status: DC | PRN
  Filled 2013-09-13: qty 1

## 2013-09-13 MED ORDER — PROPOFOL 10 MG/ML IV BOLUS
INTRAVENOUS | Status: DC | PRN
  Administered 2013-09-13: 150 mg via INTRAVENOUS

## 2013-09-13 MED ORDER — FENTANYL CITRATE 0.05 MG/ML IJ SOLN
INTRAMUSCULAR | Status: AC
  Filled 2013-09-13: qty 6

## 2013-09-13 MED ORDER — LIDOCAINE HCL (CARDIAC) 20 MG/ML IV SOLN
INTRAVENOUS | Status: DC | PRN
  Administered 2013-09-13: 60 mg via INTRAVENOUS

## 2013-09-13 MED ORDER — EPHEDRINE SULFATE 50 MG/ML IJ SOLN
INTRAMUSCULAR | Status: DC | PRN
  Administered 2013-09-13 (×3): 10 mg via INTRAVENOUS

## 2013-09-13 SURGICAL SUPPLY — 38 items
BAG URINE DRAINAGE (UROLOGICAL SUPPLIES) ×3 IMPLANT
BAG URINE LEG 19OZ MD ST LTX (BAG) IMPLANT
BLADE SURG ROTATE 9660 (MISCELLANEOUS) ×3 IMPLANT
BNDG CONFORM 3 STRL LF (GAUZE/BANDAGES/DRESSINGS) IMPLANT
BOOTIES KNEE HIGH SLOAN (MISCELLANEOUS) ×3 IMPLANT
CANISTER SUCTION 2500CC (MISCELLANEOUS) IMPLANT
CATH FOLEY 2WAY SLVR  5CC 18FR (CATHETERS) ×2
CATH FOLEY 2WAY SLVR 5CC 18FR (CATHETERS) ×1 IMPLANT
CHARGE TECH PROCEDURE ONCURA (LABOR (TRAVEL & OVERTIME)) ×3 IMPLANT
CLOTH BEACON ORANGE TIMEOUT ST (SAFETY) ×3 IMPLANT
COVER MAYO STAND STRL (DRAPES) ×3 IMPLANT
COVER TABLE BACK 60X90 (DRAPES) ×3 IMPLANT
DRAPE CAMERA CLOSED 9X96 (DRAPES) ×3 IMPLANT
DRAPE INCISE IOBAN 66X45 STRL (DRAPES) ×3 IMPLANT
DRAPE UNDERBUTTOCKS STRL (DRAPE) ×3 IMPLANT
DRSG TEGADERM 4X4.75 (GAUZE/BANDAGES/DRESSINGS) ×3 IMPLANT
DRSG TEGADERM 8X12 (GAUZE/BANDAGES/DRESSINGS) IMPLANT
GAS ARGON HIGH PRESSURE (MEDICAL GASES) ×3 IMPLANT
GAS HELIUM HIGH PRESSURE (MEDICAL GASES) ×3 IMPLANT
GLOVE BIO SURGEON STRL SZ7.5 (GLOVE) ×6 IMPLANT
GOWN STRL REUS W/ TWL XL LVL3 (GOWN DISPOSABLE) ×1 IMPLANT
GOWN STRL REUS W/TWL XL LVL3 (GOWN DISPOSABLE) ×5 IMPLANT
GUIDEWIRE SUPER STIFF (WIRE) ×3 IMPLANT
HOLDER FOLEY CATH W/STRAP (MISCELLANEOUS) ×3 IMPLANT
KIT ICE ROD PROCEDURE PRECISE (DISPOSABLE) ×3 IMPLANT
KIT PROSTATE PRESICE I (KITS) IMPLANT
NEEDLE SPNL 18GX3.5 QUINCKE PK (NEEDLE) IMPLANT
PACK BASIN DAY SURGERY FS (CUSTOM PROCEDURE TRAY) ×3 IMPLANT
PLUG CATH AND CAP STER (CATHETERS) ×3 IMPLANT
SET IRRIG Y TYPE TUR BLADDER L (SET/KITS/TRAYS/PACK) ×3 IMPLANT
SHEET LAVH (DRAPES) ×3 IMPLANT
SPONGE GAUZE 4X4 12PLY (GAUZE/BANDAGES/DRESSINGS) IMPLANT
SPONGE GAUZE 4X4 12PLY STER LF (GAUZE/BANDAGES/DRESSINGS) ×3 IMPLANT
SYRINGE 10CC LL (SYRINGE) ×3 IMPLANT
SYRINGE IRR TOOMEY STRL 70CC (SYRINGE) IMPLANT
TRAY DSU PREP LF (CUSTOM PROCEDURE TRAY) ×3 IMPLANT
UNDERPAD 30X30 INCONTINENT (UNDERPADS AND DIAPERS) ×3 IMPLANT
WATER STERILE IRR 500ML POUR (IV SOLUTION) ×3 IMPLANT

## 2013-09-13 NOTE — Interval H&P Note (Signed)
History and Physical Interval Note:  09/13/2013 9:02 AM  Colin Lewis  has presented today for surgery, with the diagnosis of PROSTATE CANCER  The various methods of treatment have been discussed with the patient and family. After consideration of risks, benefits and other options for treatment, the patient has consented to  Procedure(s): CRYO ABLATION PROSTATE (N/A) as a surgical intervention .  The patient's history has been reviewed, patient examined, no change in status, stable for surgery.  I have reviewed the patient's chart and labs.  Questions were answered to the patient's satisfaction.     Carolan Clines I

## 2013-09-13 NOTE — Anesthesia Procedure Notes (Signed)
Procedure Name: LMA Insertion Date/Time: 09/13/2013 9:28 AM Performed by: Justice Rocher Pre-anesthesia Checklist: Patient identified, Emergency Drugs available, Suction available and Patient being monitored Patient Re-evaluated:Patient Re-evaluated prior to inductionOxygen Delivery Method: Circle System Utilized Preoxygenation: Pre-oxygenation with 100% oxygen Intubation Type: IV induction Ventilation: Mask ventilation without difficulty LMA: LMA inserted LMA Size: 4.0 Number of attempts: 1 Airway Equipment and Method: bite block Placement Confirmation: positive ETCO2 Tube secured with: Tape Dental Injury: Teeth and Oropharynx as per pre-operative assessment

## 2013-09-13 NOTE — H&P (Signed)
Chief Complaint cc: Dr. Mellody Drown   Reason For Visit Consult   Active Problems Problems  1. Prostate cancer (19)  History of Present Illness     80 yo male returns today for consult to review pathology after prostate biopsy on 06/09/13 for hx of prostate cancer. He is s/p prostate biopsy on 05/26/12 for hx of Lt prostate nodule & elevated PSA of 5.2. Pathology showed PCa found in the R apex lateral biopsy, in 60% of the bx. (gleason 3+3=6). He has had negative metastatic CT-done for stone disease-and does not need bone scan. He as a 1.8-3% chance of bone mets, and minimal chance of lymph node disease, as calculated from both the [employer] and the [employer]. He has a 17% chance of having an indolent cancer and 9% chance of extracapsular extension. and 1% chance of seminal vesical involvement.     Originally referred back by Dr. Mellody Drown for further evaluation of an elevated PSA and a Lt renal stone. No family hx of prostate cancer. 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    12/04/11 PSA - 5.50     02/20/12 KUB - 1cm Lt upper pole renal stone      Past Medical History Problems  1. History of Abdominal pain, LLQ (left lower quadrant) (789.04) 2. History of Calculus of ureter (592.1) 3. History of Calculus of ureter (592.1) 4. History of Flank Pain Right 5. History of constipation (V12.79) 6. History of hypercholesterolemia (V12.29) 7. History of kidney stones (V13.01) 8. History of nausea (V12.79) 9. History of Hydronephrosis, left (591)  Surgical History Problems  1. History of Cystoscopy With Insertion Of Ureteral Stent Left 2. History of Cystoscopy With Insertion Of Ureteral Stent Right 3. History of Cystoscopy With Ureteroscopy With Lithotripsy 4. History of Cystoscopy With Ureteroscopy With Lithotripsy 5. History of Lithotripsy  Current Meds 1. Aspirin 81 MG Oral Tablet;  Therapy: (Recorded:22Jan2014) to Recorded 2. Levofloxacin 500 MG Oral  Tablet; 1 tablet the day before the procedure, 1 tablet the day of  the procedure, and 1 tablet the day after the procedure;  Therapy: [id number] to (Last Rx:16Mar2015)  Requested for: [id number] Ordered 3. Losartan Potassium 50 MG Oral Tablet;  Therapy: 18Sep2013 to Recorded 4. Simvastatin 20 MG Oral Tablet;  Therapy: 25Apr2013 to Recorded  Allergies Medication  1. No Known Drug Allergies  Family History Problems  1. Family history of Death In The Family Father : Father 2. Family history of Death In The Family Mother : Mother 81. Family history of Family Health Status Number Of Children  Social History Problems    Denied: Alcohol Use   Caffeine Use   Marital History - Currently Married   Never A Smoker   Occupation:   Denied: Tobacco Use  Review of Systems Genitourinary, constitutional, skin, eye, otolaryngeal, hematologic/lymphatic, cardiovascular, pulmonary, endocrine, musculoskeletal, gastrointestinal, neurological and psychiatric system(s) were reviewed and pertinent findings if present are noted.  Genitourinary: urinary frequency and feelings of urinary urgency.  ENT: sinus problems.  Musculoskeletal: back pain and joint pain.    Vitals Vital Signs [Data Includes: Last 1 Day]  Recorded: 20Apr2015 05:06PM  Blood Pressure: 156 / 79 Temperature: 97.8 F Heart Rate: 56  Physical Exam Constitutional: Well nourished and well developed . No acute distress.  ENT:. The ears and nose are normal in appearance.  Neck: The appearance of the neck is normal and no neck mass is present.  Pulmonary: No respiratory distress and normal respiratory rhythm and effort.  Cardiovascular:  Heart rate and rhythm are normal . No peripheral edema.  Abdomen: The abdomen is flat. The abdomen is soft and nontender. No masses are palpated. No CVA tenderness. No hernias are palpable. No hepatosplenomegaly noted.  Rectal: Rectal exam demonstrates normal sphincter tone and the anus is normal on  inspection. Estimated prostate size is 3+. The prostate has a palpable nodule involving the mid aspect, entire left lobe of the prostate which appears to be confined within the prostate capsule, is not indurated, is not tender and is not fluctuant. The left seminal vesicle is nonpalpable. The right seminal vesicle is nonpalpable. The perineum is normal on inspection.  Genitourinary: Examination of the penis demonstrates no discharge, no masses, no lesions and a normal meatus. The scrotum is without lesions. The right epididymis is palpably normal and non-tender. The left epididymis is palpably normal and non-tender. The right testis is non-tender and without masses. The left testis is non-tender and without masses.  Lymphatics: The femoral and inguinal nodes are not enlarged or tender.  Skin: Normal skin turgor, no visible rash and no visible skin lesions.  Neuro/Psych:. Mood and affect are appropriate.    Assessment Assessed  1. Prostate cancer (185) 2. Prostate Hard Area Or Nodule On The Left 3. 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 No PIN this year. We have discussed options for his Rx, and , he understands that, at 81, that I would not recommend any radical surgery or radiation therapy. However, He could opt for no rx, or delayed hormone therapy, but if he decides to be treated , that he could have focal therapy of the Right side of the prostate. He wiuld have EED from cryotherapy of the Right side of the prostate, and would need q 4 months psa and possible future biopsy fo the same and the other side also.    He uses heavy equipment, and understands that he could have difficulty voiding following cryotherapy until the edema subsides. He will need alpha-blocker therapy. he understands that quality of life and treatment plans are important to consider.    Plan Bladder  outlet obstruction, Prostate cancer, Prostate Hard Area Or Nodule On The Left  1. Follow-up Month x 4 Office  Follow-up  Status: Hold For - Appointment,Date of Service   Requested for: 20Apr2015 Prostate cancer  2. PSA; Status:Hold For - Specimen/Data Collection,Exact Date,Appointment; Requested  [id number] next appointment;   Pt will discuss with his wife and may call to schedule or discuss.   Signatures Electronically signed by : Carolan Clines, M.D.; Jul 05 2013  5:44PM EST  Chief Complaint cc: Dr. Mellody Drown   Reason For Visit Consult   Active Problems Problems  1. Prostate cancer (83)  History of Present Illness     80 yo male returns today for consult to review pathology after prostate biopsy on 06/09/13 for hx of prostate cancer. He is s/p prostate biopsy on 05/26/12 for hx of Lt prostate nodule & elevated PSA of 5.2. Pathology showed PCa found in the R apex lateral biopsy, in 60% of the bx. (gleason 3+3=6). He has had negative metastatic CT-done for stone disease-and does not need bone scan. He as a 1.8-3% chance of bone mets, and minimal chance of lymph node disease, as calculated from both the [employer] and the [employer]. He has a 17% chance of having an indolent cancer  and 9% chance of extracapsular extension. and 1% chance of seminal vesical involvement.     Originally referred back by Dr. Mellody Drown for further evaluation of an elevated PSA and a Lt renal stone. No family hx of prostate cancer. 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    12/04/11 PSA - 5.50     02/20/12 KUB - 1cm Lt upper pole renal stone      Past Medical History Problems  1. History of Abdominal pain, LLQ (left lower quadrant) (789.04) 2. History of Calculus of ureter (592.1) 3. History of Calculus of ureter (592.1) 4. History of Flank Pain Right 5. History of constipation (V12.79) 6. History of hypercholesterolemia (V12.29) 7. History of kidney stones (V13.01) 8.  History of nausea (V12.79) 9. History of Hydronephrosis, left (591)  Surgical History Problems  1. History of Cystoscopy With Insertion Of Ureteral Stent Left 2. History of Cystoscopy With Insertion Of Ureteral Stent Right 3. History of Cystoscopy With Ureteroscopy With Lithotripsy 4. History of Cystoscopy With Ureteroscopy With Lithotripsy 5. History of Lithotripsy  Current Meds 1. Aspirin 81 MG Oral Tablet;  Therapy: (Recorded:22Jan2014) to Recorded 2. Levofloxacin 500 MG Oral Tablet; 1 tablet the day before the procedure, 1 tablet the day of  the procedure, and 1 tablet the day after the procedure;  Therapy: [id number] to (Last Rx:16Mar2015)  Requested for: [id number] Ordered 3. Losartan Potassium 50 MG Oral Tablet;  Therapy: 18Sep2013 to Recorded 4. Simvastatin 20 MG Oral Tablet;  Therapy: 25Apr2013 to Recorded  Allergies Medication  1. No Known Drug Allergies  Family History Problems  1. Family history of Death In The Family Father : Father 2. Family history of Death In The Family Mother : Mother 36. Family history of Family Health Status Number Of Children  Social History Problems    Denied: Alcohol Use   Caffeine Use   Marital History - Currently Married   Never A Smoker   Occupation:   Denied: Tobacco Use  Review of Systems Genitourinary, constitutional, skin, eye, otolaryngeal, hematologic/lymphatic, cardiovascular, pulmonary, endocrine, musculoskeletal, gastrointestinal, neurological and psychiatric system(s) were reviewed and pertinent findings if present are noted.  Genitourinary: urinary frequency and feelings of urinary urgency.  ENT: sinus problems.  Musculoskeletal: back pain and joint pain.    Vitals Vital Signs [Data Includes: Last 1 Day]  Recorded: 20Apr2015 05:06PM  Blood Pressure: 156 / 79 Temperature: 97.8 F Heart Rate: 56  Physical Exam Constitutional: Well nourished and well developed . No acute distress.  ENT:. The ears and nose are  normal in appearance.  Neck: The appearance of the neck is normal and no neck mass is present.  Pulmonary: No respiratory distress and normal respiratory rhythm and effort.  Cardiovascular: Heart rate and rhythm are normal . No peripheral edema.  Abdomen: The abdomen is flat. The abdomen is soft and nontender. No masses are palpated. No CVA tenderness. No hernias are palpable. No hepatosplenomegaly noted.  Rectal: Rectal exam demonstrates normal sphincter tone and the anus is normal on inspection. Estimated prostate size is 3+. The prostate has a palpable nodule involving the mid aspect, entire left lobe of the prostate which appears to be confined within the prostate capsule, is not indurated, is not tender and is not fluctuant. The left seminal vesicle is nonpalpable. The right seminal vesicle is nonpalpable. The perineum is normal on inspection.  Genitourinary: Examination of the penis demonstrates no discharge, no masses, no lesions and a normal meatus. The scrotum is without lesions. The right epididymis is  palpably normal and non-tender. The left epididymis is palpably normal and non-tender. The right testis is non-tender and without masses. The left testis is non-tender and without masses.  Lymphatics: The femoral and inguinal nodes are not enlarged or tender.  Skin: Normal skin turgor, no visible rash and no visible skin lesions.  Neuro/Psych:. Mood and affect are appropriate.    Assessment Assessed  1. Prostate cancer (185) 2. Prostate Hard Area Or Nodule On The Left 3. 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 No PIN this year. We have discussed options for his Rx, and , he understands that, at 89, that I would not recommend any radical surgery or radiation therapy. However, He could opt for no rx, or delayed hormone therapy, but if he decides to be treated , that he  could have focal therapy of the Right side of the prostate. He wiuld have EED from cryotherapy of the Right side of the prostate, and would need q 4 months psa and possible future biopsy fo the same and the other side also.    He uses heavy equipment, and understands that he could have difficulty voiding following cryotherapy until the edema subsides. He will need alpha-blocker therapy. he understands that quality of life and treatment plans are important to consider.    Plan Bladder outlet obstruction, Prostate cancer, Prostate Hard Area Or Nodule On The Left  1. Follow-up Month x 4 Office  Follow-up  Status: Hold For - Appointment,Date of Service   Requested for: 20Apr2015 Prostate cancer  2. PSA; Status:Hold For - Specimen/Data Collection,Exact Date,Appointment; Requested  [id number] next appointment;      Signatures

## 2013-09-13 NOTE — Anesthesia Postprocedure Evaluation (Signed)
Anesthesia Post Note  Patient: Colin Lewis  Procedure(s) Performed: Procedure(s) (LRB): CRYO ABLATION PROSTATE (N/A)  Anesthesia type: General  Patient location: PACU  Post pain: Pain level controlled  Post assessment: Post-op Vital signs reviewed  Last Vitals:  Filed Vitals:   09/13/13 1230  BP: 137/62  Pulse: 74  Temp:   Resp: 12    Post vital signs: Reviewed  Level of consciousness: sedated  Complications: No apparent anesthesia complications

## 2013-09-13 NOTE — Transfer of Care (Signed)
Immediate Anesthesia Transfer of Care Note  Patient: Colin Lewis  Procedure(s) Performed: Procedure(s) (LRB): CRYO ABLATION PROSTATE (N/A)  Patient Location: PACU  Anesthesia Type: General  Level of Consciousness: awake, sedated, patient cooperative and responds to stimulation  Airway & Oxygen Therapy: Patient Spontanous Breathing and Patient connected to face mask oxygen  Post-op Assessment: Report given to PACU RN, Post -op Vital signs reviewed and stable and Patient moving all extremities  Post vital signs: Reviewed and stable  Complications: No apparent anesthesia complications

## 2013-09-13 NOTE — Anesthesia Preprocedure Evaluation (Signed)
Anesthesia Evaluation  Patient identified by MRN, date of birth, ID band Patient awake    Reviewed: Allergy & Precautions, H&P , NPO status , Patient's Chart, lab work & pertinent test results  Airway Mallampati: II TM Distance: >3 FB Neck ROM: Full    Dental  (+) Teeth Intact, Dental Advisory Given   Pulmonary neg pulmonary ROS,  breath sounds clear to auscultation  Pulmonary exam normal       Cardiovascular hypertension, Pt. on medications negative cardio ROS  Rhythm:Regular Rate:Normal     Neuro/Psych negative neurological ROS  negative psych ROS   GI/Hepatic negative GI ROS, Neg liver ROS,   Endo/Other  negative endocrine ROS  Renal/GU negative Renal ROS  negative genitourinary   Musculoskeletal negative musculoskeletal ROS (+)   Abdominal   Peds  Hematology negative hematology ROS (+)   Anesthesia Other Findings   Reproductive/Obstetrics                           Anesthesia Physical Anesthesia Plan  ASA: II  Anesthesia Plan: General   Post-op Pain Management:    Induction: Intravenous  Airway Management Planned: LMA  Additional Equipment:   Intra-op Plan:   Post-operative Plan: Extubation in OR  Informed Consent: I have reviewed the patients History and Physical, chart, labs and discussed the procedure including the risks, benefits and alternatives for the proposed anesthesia with the patient or authorized representative who has indicated his/her understanding and acceptance.   Dental advisory given  Plan Discussed with: CRNA  Anesthesia Plan Comments:         Anesthesia Quick Evaluation

## 2013-09-13 NOTE — Discharge Instructions (Addendum)
INSTRUCTIONS AFTER CRYOABLATION OF THE PROSTATE  Normal Findings After Cryoablation:   You may experience a number of symptoms after the cryoablation which occur in  some patients.  There is no cause for alarm should this happen.  These    symptoms include:  -Blood in the urine.  When you are discharged after your surgery, your urine will   have some blood in it and may appear red. This occurs to some degree in all patients after cryoablation and is not cause for alarm.  Your urine should clearapproximately 24 hours after the procedure, but may persist for quite a while  longer.  -Scrotal and penile swelling and bruising.  This occurs about 2-3 days after   the cryoablation and is caused by tissue swelling which temporarily blocks the   drainage of lymph.  This is painless and resolves in less than one week.  Ice   packs and lying down for short periods of time during the day will improve the   swelling.  -Small amounts of bloody discharge from the end of your penis.  This can   occur for up to 6 weeks after the procedure and is not cause for alarm.  It is due   to some discharge from the urethra in the area of the prostate.  -Some numbness in the head of the penis.  Occasionally, when a large   amount of freezing has been performed during the procedure, the nerve which   supplies sensation to one or both sides of the head of the penis may be affected.   The sensation returns after a number of months.  Call your doctor at [phone] if any of the following occur:   -If you have any pain, fever, or chills.  -If your foley catheter or suprapubic tube is not draining urine.  -If there is decreasing urinary stream.  This may indicate sloughing of some   dead tissue in the area of the prostate near the opening to the bladder.  It may   clear on its own but,  if the problem becomes severe, it may require removal of   the dead tissue through a cystoscope.  -If you have diarrhea after urination or  foul-smelling urine.  These    symptoms may indicate an urethrorectal fistula, which is a hole between the   bladder channel and the rectum.  This should be investigated by your doctor.  -If you have any questions or problems.   Diet:  Resume your normal diet.   If you become constipated, you may try    over-the-counter remedies such as Milk of Magnesia.  If you are nauseated,   vomiting or feel bloated, notify your doctor's office.  DO NOT GIVE YOURSELF  ANY ENEMAS OR RECTAL MEDICATIONS.  THE RECTAL WALL IS THIN  AFTER CRYOABLATION.  Activity:   -You may have swelling and bruising of the penis and scrotum.  Apply ice packs   to the area behind the scrotum intermittently for 24-48 hours after your surgery to   keep the swelling down.  Wearing an athletic supporter (jock strap) might also   help.  -Lying flat on your back will also help decrease the swelling.  You may find   when you are sitting up or walking around that the swelling increases.   -You will have  puncture wounds behind your scrotum making it painful to sit   down.  Use a hemorrhoid donut to sit on if needed.  -You  may shower on the second day after surgery.  -There are no lifting or driving restrictions.  Use caution while the suprapubic tube   is in place.   Medications:  -You may resume your preoperative medications, except aspirin and other blood   thinning agents.  Unless otherwise instructed, resume your aspirin after your   suprapubic tube is removed.  If you are taking Coumadin or other blood thinners,   please discuss when to restart these medications with your surgeon.  -Unless otherwise ordered, you will be given prescriptions for the following   medications which you will need to take after your procedure:   *Antibiotics -  to prevent infection while your suprapubic tube is in place.   *Anti-inflammatory - to prevent pain and to decrease the inflammation in    the area of the prostate.  You may take ibuprofen (Motrin,  Advil),     acetaminophen (Tylenol) or naproxen (Aleve) as needed for discomfort.    Call your doctor's office at [phone] .  Patient Signature:  ________________________________________________________  Nurse's Signature:  ________________________________________________________    Post Anesthesia Home Care Instructions  Activity: Get plenty of rest for the remainder of the day. A responsible adult should stay with you for 24 hours following the procedure.  For the next 24 hours, DO NOT: -Drive a car -Paediatric nurse -Drink alcoholic beverages -Take any medication unless instructed by your physician -Make any legal decisions or sign important papers.  Meals: Start with liquid foods such as gelatin or soup. Progress to regular foods as tolerated. Avoid greasy, spicy, heavy foods. If nausea and/or vomiting occur, drink only clear liquids until the nausea and/or vomiting subsides. Call your physician if vomiting continues.  Special Instructions/Symptoms: Your throat may feel dry or sore from the anesthesia or the breathing tube placed in your throat during surgery. If this causes discomfort, gargle with warm salt water. The discomfort should disappear within 24 hours.

## 2013-09-13 NOTE — Op Note (Signed)
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 Gaynelle Arabian, MD  First assistant: None    Anesthesia:  General LMA   Preparation:  After appropriate preanesthesia, the patient was brought to the operating room, placed on the operating table in the dorsal supine position where general LMA anesthesia was introduced. The right side of the perineum was marked with a marking pen. The scrotum was prepped with Betadine solution and draped in usual fashion. The scrotum was draped out of the operative field.   Review history:  80 yo male returns today for consult to review pathology after prostate biopsy on 06/09/13 for hx of prostate cancer. He is s/p prostate biopsy on 05/26/12 for hx of Lt prostate nodule & elevated PSA of 5.2. Pathology showed PCa found in the R apex lateral biopsy, in 60% of the bx. (gleason 3+3=6). He has had negative metastatic CT-done for stone disease-and does not need bone scan. He as a 1.8-3% chance of bone mets, and minimal chance of lymph node disease, as calculated from both the [employer] and the [employer]. He has a 17% chance of having an indolent cancer and 9% chance of extracapsular extension. and 1% chance of seminal vesical involvement.  Originally referred back by Dr. Mellody Drown for further evaluation of an elevated PSA and a Lt renal stone. No family hx of prostate cancer. 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      Statement of  Likelihood of Success: Excellent. TIME-OUT observed.:  Procedure:  The transrectal ultrasound probe was placed, and the patient was noted to have a 44 cc prostate. The prostatic ultrasound stand required replacement with a different stand, and therefore a free-hand technique for needle placement was employed with  real-time ultrasound control.   Free hand needle placement was accomplished, with  3 rows of needles  placed in the right side the prostate.   Temperature probes were also placed in both an Denonvilliers fascia, and at the level of the transrectal fashion.  Cystourethroscopy was accomplished, showing no evidence of needles within the prostate urethra, or within the bladder. The bladder base was normal. The trigone was normal. The walls of the bladder were normal. There was no evidence of bladder stone, tumor, or diverticular formation.  The patient then underwent 2 separate freeze-thaw cycles. Freezing was accomplished for 10 minutes with active thaw. This was accomplished with argon/helium. A second freeze thaw cycle was accomplished, with active freezing, and active/passive thawing. Following the second cycle, the warming device was left in place for 20 minutes. It was then exchanged for a 16 French Foley catheter.  The patient was given IV Toradol, and IV Tylenol. He was awakened and taken to recovery room in good condition. He was covered with 2 g of IV Ancef.

## 2013-09-15 ENCOUNTER — Encounter (HOSPITAL_BASED_OUTPATIENT_CLINIC_OR_DEPARTMENT_OTHER): Payer: Self-pay | Admitting: Urology

## 2013-09-16 ENCOUNTER — Encounter (HOSPITAL_BASED_OUTPATIENT_CLINIC_OR_DEPARTMENT_OTHER): Payer: Self-pay | Admitting: Urology

## 2014-02-23 ENCOUNTER — Ambulatory Visit (INDEPENDENT_AMBULATORY_CARE_PROVIDER_SITE_OTHER): Payer: Medicare Other | Admitting: Ophthalmology

## 2014-02-23 DIAGNOSIS — H26493 Other secondary cataract, bilateral: Secondary | ICD-10-CM

## 2014-02-23 DIAGNOSIS — I10 Essential (primary) hypertension: Secondary | ICD-10-CM

## 2014-02-23 DIAGNOSIS — H35033 Hypertensive retinopathy, bilateral: Secondary | ICD-10-CM

## 2014-02-23 DIAGNOSIS — D3131 Benign neoplasm of right choroid: Secondary | ICD-10-CM

## 2014-02-23 DIAGNOSIS — H43813 Vitreous degeneration, bilateral: Secondary | ICD-10-CM

## 2014-03-02 ENCOUNTER — Ambulatory Visit (INDEPENDENT_AMBULATORY_CARE_PROVIDER_SITE_OTHER): Payer: Medicare Other | Admitting: Ophthalmology

## 2014-03-02 DIAGNOSIS — H26492 Other secondary cataract, left eye: Secondary | ICD-10-CM

## 2014-03-25 ENCOUNTER — Ambulatory Visit (INDEPENDENT_AMBULATORY_CARE_PROVIDER_SITE_OTHER): Payer: Medicare Other | Admitting: Ophthalmology

## 2014-03-25 DIAGNOSIS — H2703 Aphakia, bilateral: Secondary | ICD-10-CM

## 2014-07-05 ENCOUNTER — Other Ambulatory Visit: Payer: Self-pay | Admitting: Internal Medicine

## 2014-07-05 ENCOUNTER — Ambulatory Visit
Admission: RE | Admit: 2014-07-05 | Discharge: 2014-07-05 | Disposition: A | Discharge status: Home / Self Care | Payer: Medicare Other | Source: Ambulatory Visit | Attending: Internal Medicine | Admitting: Internal Medicine

## 2014-07-05 DIAGNOSIS — R059 Cough, unspecified: Secondary | ICD-10-CM

## 2014-07-05 DIAGNOSIS — R05 Cough: Secondary | ICD-10-CM

## 2015-03-28 ENCOUNTER — Ambulatory Visit (INDEPENDENT_AMBULATORY_CARE_PROVIDER_SITE_OTHER): Payer: Medicare Other | Admitting: Ophthalmology

## 2015-04-25 ENCOUNTER — Ambulatory Visit (INDEPENDENT_AMBULATORY_CARE_PROVIDER_SITE_OTHER): Payer: Medicare Other | Admitting: Ophthalmology

## 2015-04-25 DIAGNOSIS — H35033 Hypertensive retinopathy, bilateral: Secondary | ICD-10-CM

## 2015-04-25 DIAGNOSIS — I10 Essential (primary) hypertension: Secondary | ICD-10-CM | POA: Diagnosis not present

## 2015-04-25 DIAGNOSIS — D3131 Benign neoplasm of right choroid: Secondary | ICD-10-CM

## 2015-04-25 DIAGNOSIS — H43813 Vitreous degeneration, bilateral: Secondary | ICD-10-CM | POA: Diagnosis not present

## 2015-05-29 ENCOUNTER — Ambulatory Visit
Admission: RE | Admit: 2015-05-29 | Discharge: 2015-05-29 | Disposition: A | Discharge status: Home / Self Care | Payer: Medicare Other | Source: Ambulatory Visit | Attending: Internal Medicine | Admitting: Internal Medicine

## 2015-05-29 ENCOUNTER — Other Ambulatory Visit: Payer: Self-pay | Admitting: Internal Medicine

## 2015-05-29 DIAGNOSIS — R059 Cough, unspecified: Secondary | ICD-10-CM

## 2015-05-29 DIAGNOSIS — R05 Cough: Secondary | ICD-10-CM

## 2015-07-05 ENCOUNTER — Other Ambulatory Visit: Payer: Self-pay | Admitting: Internal Medicine

## 2015-07-05 ENCOUNTER — Ambulatory Visit
Admission: RE | Admit: 2015-07-05 | Discharge: 2015-07-05 | Disposition: A | Discharge status: Home / Self Care | Payer: Medicare Other | Source: Ambulatory Visit | Attending: Internal Medicine | Admitting: Internal Medicine

## 2015-07-05 DIAGNOSIS — Z09 Encounter for follow-up examination after completed treatment for conditions other than malignant neoplasm: Secondary | ICD-10-CM

## 2016-04-26 ENCOUNTER — Ambulatory Visit (INDEPENDENT_AMBULATORY_CARE_PROVIDER_SITE_OTHER): Payer: Medicare Other | Admitting: Ophthalmology

## 2016-05-13 ENCOUNTER — Ambulatory Visit (INDEPENDENT_AMBULATORY_CARE_PROVIDER_SITE_OTHER): Payer: Medicare Other | Admitting: Ophthalmology

## 2016-05-13 DIAGNOSIS — H43813 Vitreous degeneration, bilateral: Secondary | ICD-10-CM

## 2016-05-13 DIAGNOSIS — I10 Essential (primary) hypertension: Secondary | ICD-10-CM

## 2016-05-13 DIAGNOSIS — D3131 Benign neoplasm of right choroid: Secondary | ICD-10-CM | POA: Diagnosis not present

## 2016-05-13 DIAGNOSIS — H35033 Hypertensive retinopathy, bilateral: Secondary | ICD-10-CM | POA: Diagnosis not present

## 2016-05-30 ENCOUNTER — Other Ambulatory Visit: Payer: Self-pay | Admitting: Gastroenterology

## 2016-05-30 DIAGNOSIS — R1310 Dysphagia, unspecified: Secondary | ICD-10-CM

## 2016-05-30 DIAGNOSIS — R1319 Other dysphagia: Secondary | ICD-10-CM

## 2016-05-31 ENCOUNTER — Ambulatory Visit
Admission: RE | Admit: 2016-05-31 | Discharge: 2016-05-31 | Disposition: A | Discharge status: Home / Self Care | Payer: Medicare Other | Source: Ambulatory Visit | Attending: Gastroenterology | Admitting: Gastroenterology

## 2016-05-31 DIAGNOSIS — R1319 Other dysphagia: Secondary | ICD-10-CM

## 2016-05-31 DIAGNOSIS — R1310 Dysphagia, unspecified: Secondary | ICD-10-CM

## 2016-06-05 ENCOUNTER — Ambulatory Visit
Admission: RE | Admit: 2016-06-05 | Discharge: 2016-06-05 | Disposition: A | Discharge status: Home / Self Care | Payer: Medicare Other | Source: Ambulatory Visit | Attending: Internal Medicine | Admitting: Internal Medicine

## 2016-06-05 ENCOUNTER — Other Ambulatory Visit: Payer: Self-pay | Admitting: Internal Medicine

## 2016-06-05 DIAGNOSIS — N23 Unspecified renal colic: Secondary | ICD-10-CM

## 2016-06-28 ENCOUNTER — Ambulatory Visit
Admission: RE | Admit: 2016-06-28 | Discharge: 2016-06-28 | Disposition: A | Discharge status: Home / Self Care | Payer: Medicare Other | Source: Ambulatory Visit | Attending: Internal Medicine | Admitting: Internal Medicine

## 2016-06-28 ENCOUNTER — Other Ambulatory Visit: Payer: Self-pay | Admitting: Internal Medicine

## 2016-06-28 DIAGNOSIS — M545 Low back pain, unspecified: Secondary | ICD-10-CM

## 2016-07-04 ENCOUNTER — Other Ambulatory Visit: Payer: Self-pay | Admitting: Urology

## 2016-07-04 ENCOUNTER — Other Ambulatory Visit: Payer: Self-pay | Admitting: Radiation Oncology

## 2016-07-04 DIAGNOSIS — C61 Malignant neoplasm of prostate: Secondary | ICD-10-CM

## 2016-07-16 ENCOUNTER — Encounter (HOSPITAL_COMMUNITY)
Admission: RE | Admit: 2016-07-16 | Discharge: 2016-07-16 | Disposition: A | Discharge status: Home / Self Care | Payer: Medicare Other | Source: Ambulatory Visit | Attending: Urology | Admitting: Urology

## 2016-07-16 DIAGNOSIS — C61 Malignant neoplasm of prostate: Secondary | ICD-10-CM | POA: Insufficient documentation

## 2016-07-16 MED ORDER — AXUMIN (FLUCICLOVINE F 18) INJECTION
7.8800 | Freq: Once | INTRAVENOUS | Status: AC
  Administered 2016-07-16: 7.88 via INTRAVENOUS

## 2016-07-24 ENCOUNTER — Encounter (INDEPENDENT_AMBULATORY_CARE_PROVIDER_SITE_OTHER): Payer: Self-pay | Admitting: Orthopaedic Surgery

## 2016-07-24 ENCOUNTER — Ambulatory Visit (INDEPENDENT_AMBULATORY_CARE_PROVIDER_SITE_OTHER): Payer: Medicare Other | Admitting: Orthopaedic Surgery

## 2016-07-24 VITALS — Ht 66.0 in | Wt 158.0 lb

## 2016-07-24 DIAGNOSIS — M5441 Lumbago with sciatica, right side: Secondary | ICD-10-CM | POA: Diagnosis not present

## 2016-07-24 DIAGNOSIS — G8929 Other chronic pain: Secondary | ICD-10-CM | POA: Diagnosis not present

## 2016-07-24 MED ORDER — GABAPENTIN 100 MG PO CAPS: 100.0000 mg | ORAL_CAPSULE | Freq: Every day | ORAL | 0 refills | Status: DC

## 2016-07-24 NOTE — Progress Notes (Signed)
Office Visit Note   Patient: Colin Lewis           Date of Birth: 03-02-1933           MRN: [id number] Visit Date: 07/24/2016              Requested by: Jilda Panda, MD [id number] [place], [place] [address] PCP: Jilda Panda, MD   Assessment & Plan: Visit Diagnoses:  1. Chronic right-sided low back pain with right-sided sciatica     Plan: I will put him on a low-dose of Neurontin 100 mg to take at bedtime. He can always increase that dose if needed. I do feel that he would benefit from an injection in his lumbar spine which would most likely be at L3-L4 based on his plain films. However an MRI is definitely warranted to see where the highest level stenosis is as well as see if there is any nerve impingement to help guide where our physiatry's Dr. Ernestina Patches can perform an intervention. The patient agrees with this. All questions were encouraged and answered.  Follow-Up Instructions: Return in about 2 weeks (around 08/07/2016).   Orders:  No orders of the defined types were placed in this encounter.  Meds ordered this encounter  Medications  . gabapentin (NEURONTIN) 100 MG capsule    Sig: Take 1 capsule (100 mg total) by mouth at bedtime.    Dispense:  30 capsule    Refill:  0      Procedures: No procedures performed   Clinical Data: No additional findings.   Subjective: Chief Complaint  Patient presents with  . Lower Back - Pain    LBP with right radicular pain. Chronic problem. + sleep disturbance. Pain worse in leg when laying down.    Patient is very pleasant 83 year old gentleman is very active. He is a thin individual is been having worsening back pain for some time now. Sometimes he gets debilitating where he can't even get out of bed. He's had a muscle relaxant and pain medicine the past but is wary of taken those things. He denies any change in bowel or bladder function. He does get some right lower extremity radicular symptoms. He has again a severe weakness in  his legs mainly she is dealing with the pain in his back. Is been slowly worsening but an ongoing problem for several months now. His right leg bothers him when laying down at night and he's having problems getting any type of sleep at night. The pain still present with sitting but not as bad with moving around. HPI  Review of Systems He currently denies any chest pain, headache, shortness of breath, fever, chills, nausea, vomiting.  Objective: Vital Signs: Ht 5\' 6"  (1.676 m)   Wt 158 lb (71.7 kg)   BMI 25.50 kg/m   Physical Exam He is alert and oriented 3 and in no acute distress Ortho Exam He has a negative straight leg raise bilaterally. He had good strength in his lower legs. There is radicular symptoms on stretch and sciatic nerve going down his right side only and not his left. He has significant limitations in flexion extension of his lumbar spine which I think are more mechanical than they are pain related. Specialty Comments:  No specialty comments available.  Imaging: No results found. I did review both the PET scan and the plain films. Plain films are of his lumbar spine. The PET scan did not light up around the spine. Plain films however independently  reviewed by me show multilevel degenerative disc disease and spondylosis of lumbar spine. There is facet joint arthritis at multiple levels and significant foraminal stenosis.  PMFS History: Patient Active Problem List   Diagnosis Date Noted  . Chronic right-sided low back pain with right-sided sciatica 07/24/2016   Past Medical History:  Diagnosis Date  . History of kidney stones   . History of urinary tract obstruction   . Hyperlipidemia   . Hypertension   . Prostate cancer ([place])   . Thoracic spondylosis   . Wears glasses     History reviewed. No pertinent family history.  Past Surgical History:  Procedure Laterality Date  . CATARACT EXTRACTION W/ INTRAOCULAR LENS  IMPLANT, BILATERAL  2013  . CRYOABLATION N/A  09/13/2013   Procedure: CRYO ABLATION PROSTATE;  Surgeon: Ailene Rud, MD;  Location: [facility];  Service: Urology;  Laterality: N/A;  . CYSTO/  RIGHT RETROGRADE PYELOGRAM/  RIGHT URETERAL STENT PLACEMENT  01-18-2001  . CYSTO/ RIGHT RETROGRADE PYELOGRAM/ RIGHT URETEROSCOPIC LASER LITHOTRIPSY STONE EXTRACTION / STENT PLACEMENT  10-28-2007  . DILATATION MEATAL STENOSIS/  CYSTOSCOPY/  LEFT RETROGRADE PYELOGRAM/ LEFT URETEROSOPIC LASER LITHOTRIPSY STONE EXTRACTION / STENT PLACEMENT  10-04-1999  . EXTRACORPOREAL SHOCK WAVE LITHOTRIPSY Left 05-18-2012  . LEFT URETEROSCOPIC STONE EXTRACTION  10-13-2001 &  04-10-2007   Social History   Occupational History  . Not on file.   Social History Main Topics  . Smoking status: Never Smoker  . Smokeless tobacco: Never Used  . Alcohol use No  . Drug use: No  . Sexual activity: Not on file

## 2016-07-27 ENCOUNTER — Encounter (HOSPITAL_COMMUNITY): Payer: Self-pay | Admitting: Emergency Medicine

## 2016-07-27 ENCOUNTER — Emergency Department (HOSPITAL_COMMUNITY)
Admission: EM | Admit: 2016-07-27 | Discharge: 2016-07-27 | Disposition: A | Discharge status: Home / Self Care | Payer: Medicare Other | Attending: Emergency Medicine | Admitting: Emergency Medicine

## 2016-07-27 DIAGNOSIS — Z79899 Other long term (current) drug therapy: Secondary | ICD-10-CM | POA: Diagnosis not present

## 2016-07-27 DIAGNOSIS — R21 Rash and other nonspecific skin eruption: Secondary | ICD-10-CM | POA: Diagnosis present

## 2016-07-27 DIAGNOSIS — Z8546 Personal history of malignant neoplasm of prostate: Secondary | ICD-10-CM | POA: Diagnosis not present

## 2016-07-27 DIAGNOSIS — B029 Zoster without complications: Secondary | ICD-10-CM | POA: Diagnosis not present

## 2016-07-27 DIAGNOSIS — I10 Essential (primary) hypertension: Secondary | ICD-10-CM | POA: Diagnosis not present

## 2016-07-27 MED ORDER — PREDNISONE 20 MG PO TABS: ORAL_TABLET | ORAL | 0 refills | Status: DC

## 2016-07-27 MED ORDER — OXYCODONE-ACETAMINOPHEN 5-325 MG PO TABS: 1.0000 | ORAL_TABLET | ORAL | 0 refills | Status: DC | PRN

## 2016-07-27 MED ORDER — VALACYCLOVIR HCL 1 G PO TABS: 1000.0000 mg | ORAL_TABLET | Freq: Three times a day (TID) | ORAL | 0 refills | Status: AC

## 2016-07-27 NOTE — ED Notes (Signed)
See provider assessment 

## 2016-07-27 NOTE — Discharge Instructions (Signed)
Take the prescribed medication as directed.  Do not drive while taking the pain medication, it can make you sleepy/drowsy. Follow-up with your primary care doctor. Return to the ED for new or worsening symptoms.

## 2016-07-27 NOTE — ED Provider Notes (Signed)
[facility] DEPT Provider Note   CSN: [id number] Arrival date & time: 07/27/16  2124     History   Chief Complaint Chief Complaint  Patient presents with  . Rash    HPI Colin Lewis is a 83 y.o. male.  The history is provided by the patient and medical records.  Rash      83 year old male with history of kidney stones, hyperlipidemia, hypertension, history of prostate cancer s/p resection, sciatic nerve issues, presenting to the ED for rash.  Patient states he first noticed this yesterday on his right lateral thigh.  Rash has vesicles noted, no drainage.  States rash is painful with some "burning" beneath the skin.  He denies fever, chills.  No new soaps/detergents.  No tick bites or plant exposures.  Hx of chicken pox as a kid.  Past Medical History:  Diagnosis Date  . History of kidney stones   . History of urinary tract obstruction   . Hyperlipidemia   . Hypertension   . Prostate cancer ([place])   . Thoracic spondylosis   . Wears glasses     Patient Active Problem List   Diagnosis Date Noted  . Chronic right-sided low back pain with right-sided sciatica 07/24/2016    Past Surgical History:  Procedure Laterality Date  . CATARACT EXTRACTION W/ INTRAOCULAR LENS  IMPLANT, BILATERAL  2013  . CRYOABLATION N/A 09/13/2013   Procedure: CRYO ABLATION PROSTATE;  Surgeon: Ailene Rud, MD;  Location: [facility];  Service: Urology;  Laterality: N/A;  . CYSTO/  RIGHT RETROGRADE PYELOGRAM/  RIGHT URETERAL STENT PLACEMENT  01-18-2001  . CYSTO/ RIGHT RETROGRADE PYELOGRAM/ RIGHT URETEROSCOPIC LASER LITHOTRIPSY STONE EXTRACTION / STENT PLACEMENT  10-28-2007  . DILATATION MEATAL STENOSIS/  CYSTOSCOPY/  LEFT RETROGRADE PYELOGRAM/ LEFT URETEROSOPIC LASER LITHOTRIPSY STONE EXTRACTION / STENT PLACEMENT  10-04-1999  . EXTRACORPOREAL SHOCK WAVE LITHOTRIPSY Left 05-18-2012  . LEFT URETEROSCOPIC STONE EXTRACTION  10-13-2001 &  04-10-2007       Home Medications     Prior to Admission medications   Medication Sig Start Date End Date Taking? Authorizing Provider  gabapentin (NEURONTIN) 100 MG capsule Take 1 capsule (100 mg total) by mouth at bedtime. 07/24/16   Mcarthur Rossetti, MD  losartan (COZAAR) 25 MG tablet Take 25 mg by mouth every morning.     [provider]  simvastatin (ZOCOR) 20 MG tablet Take 20 mg by mouth every morning.    [provider]  traMADol Veatrice Bourbon) 50 MG tablet  07/04/16   [provider]    Family History No family history on file.  Social History Social History  Substance Use Topics  . Smoking status: Never Smoker  . Smokeless tobacco: Never Used  . Alcohol use No     Allergies   Patient has no known allergies.   Review of Systems Review of Systems  Skin: Positive for rash.  All other systems reviewed and are negative.    Physical Exam Updated Vital Signs BP (!) 168/99 (BP Location: Left Arm)   Pulse 86   Temp 98.3 F (36.8 C) (Oral)   Resp 18   Ht 5\' 5"  (1.651 m)   Wt 71.7 kg   SpO2 98%   BMI 26.29 kg/m   Physical Exam  Constitutional: He is oriented to person, place, and time. He appears well-developed and well-nourished.  HENT:  Head: Normocephalic and atraumatic.  Mouth/Throat: Oropharynx is clear and moist.  Eyes: Conjunctivae and EOM are normal. Pupils are equal,  round, and reactive to light.  Neck: Normal range of motion.  Cardiovascular: Normal rate, regular rhythm and normal heart sounds.   Pulmonary/Chest: Effort normal and breath sounds normal.  Abdominal: Soft. Bowel sounds are normal.  Musculoskeletal: Normal range of motion.  Neurological: He is alert and oriented to person, place, and time.  Skin: Skin is warm and dry. Rash noted.  vesicular rash of right lateral thigh along the L5 dermatome; no drainage; no signs of superimposed infection or cellulitis  Psychiatric: He has a normal mood and affect.  Nursing note and vitals reviewed.    ED  Treatments / Results  Labs (all labs ordered are listed, but only abnormal results are displayed) Labs Reviewed - No data to display  EKG  EKG Interpretation None       Radiology No results found.  Procedures Procedures (including critical care time)  Medications Ordered in ED Medications - No data to display   Initial Impression / Assessment and Plan / ED Course  I have reviewed the triage vital signs and the nursing notes.  Pertinent labs & imaging results that were available during my care of the patient were reviewed by me and considered in my medical decision making (see chart for details).  83 year old male here with rash of right lateral thigh. First noticed this yesterday. He is afebrile and nontoxic. Rash is vesicular along the L5 dermatome of the right lateral thigh. No complicating features. Rash consistent with shingles. Will start on Valtrex, prednisone, and pain medication. He was instructed to follow-up with his primary care doctor for any ongoing issues.  Discussed plan with patient, he acknowledged understanding and agreed with plan of care.  Return precautions given for new or worsening symptoms.  Final Clinical Impressions(s) / ED Diagnoses   Final diagnoses:  Herpes zoster without complication    New Prescriptions New Prescriptions   OXYCODONE-ACETAMINOPHEN (PERCOCET/ROXICET) 5-325 MG TABLET    Take 1 tablet by mouth every 4 (four) hours as needed.   PREDNISONE (DELTASONE) 20 MG TABLET    Take 40 mg by mouth daily for 3 days, then 20mg  by mouth daily for 3 days, then 10mg  daily for 3 days   VALACYCLOVIR (VALTREX) 1000 MG TABLET    Take 1 tablet (1,000 mg total) by mouth 3 (three) times daily.     Larene Pickett, PA-C 07/27/16 2307    Lacretia Leigh, MD 07/28/16 [phone]

## 2016-07-27 NOTE — ED Triage Notes (Signed)
Reports noticing a rash to let upper leg yesterday.  Blister like area noted to outer right thigh.

## 2016-07-29 ENCOUNTER — Telehealth (INDEPENDENT_AMBULATORY_CARE_PROVIDER_SITE_OTHER): Payer: Self-pay

## 2016-07-29 ENCOUNTER — Other Ambulatory Visit (INDEPENDENT_AMBULATORY_CARE_PROVIDER_SITE_OTHER): Payer: Self-pay

## 2016-07-29 DIAGNOSIS — G8929 Other chronic pain: Secondary | ICD-10-CM

## 2016-07-29 DIAGNOSIS — M545 Low back pain, unspecified: Secondary | ICD-10-CM

## 2016-07-29 NOTE — Telephone Encounter (Signed)
Patient would like to know if he needs to cancel his appointment scheduled for 08/07/16 due to having Shingles

## 2016-07-29 NOTE — Telephone Encounter (Signed)
LMOM of the below message  

## 2016-07-29 NOTE — Telephone Encounter (Signed)
He still needs an MRI before his follow-up.

## 2016-07-29 NOTE — Telephone Encounter (Signed)
Patient would like to know if he needs to cancel his appointment scheduled for 08/07/16 due to having Shingles, patient also stated that no one has called to schedule an appointment for his MRI.  CB# is [phone].

## 2016-08-07 ENCOUNTER — Ambulatory Visit (INDEPENDENT_AMBULATORY_CARE_PROVIDER_SITE_OTHER): Payer: Medicare Other | Admitting: Orthopaedic Surgery

## 2016-08-16 ENCOUNTER — Ambulatory Visit
Admission: RE | Admit: 2016-08-16 | Discharge: 2016-08-16 | Disposition: A | Discharge status: Home / Self Care | Payer: Medicare Other | Source: Ambulatory Visit | Attending: Orthopaedic Surgery | Admitting: Orthopaedic Surgery

## 2016-08-16 DIAGNOSIS — G8929 Other chronic pain: Secondary | ICD-10-CM

## 2016-08-16 DIAGNOSIS — M545 Low back pain, unspecified: Secondary | ICD-10-CM

## 2016-08-20 ENCOUNTER — Encounter (INDEPENDENT_AMBULATORY_CARE_PROVIDER_SITE_OTHER): Payer: Self-pay | Admitting: Orthopaedic Surgery

## 2016-08-20 ENCOUNTER — Ambulatory Visit (INDEPENDENT_AMBULATORY_CARE_PROVIDER_SITE_OTHER): Payer: Medicare Other | Admitting: Orthopaedic Surgery

## 2016-08-20 DIAGNOSIS — G8929 Other chronic pain: Secondary | ICD-10-CM | POA: Diagnosis not present

## 2016-08-20 DIAGNOSIS — M5441 Lumbago with sciatica, right side: Secondary | ICD-10-CM | POA: Diagnosis not present

## 2016-08-20 NOTE — Progress Notes (Signed)
Colin Lewis is here for follow-up of MRI of his lumbar spine. He was having significant radicular symptoms going down his right lower extremity. We sent him for an MRI due to severity of his symptoms and significant listhesis at L4 on L5. He said since he has been on Neurontin and went through a steroid he is almost asymptomatic. He feels like it was more related to shingles of the head. He says now he is doing well and doesn't feel like he needs anything done right now.  On examination he is improved in terms of his symptoms going down his right leg barely present. He gets out of the chair easily at 83 is thin individual. He has good strength in his bilateral lower extremities.  MRI does show severe multifactorial stenosis at L4-L5 which is likely affecting the nerves right and left side.  At this point since he is essentially asymptomatic [place] treat him as conservative as possible with just occasional anti-inflammatories and medications is warranted. If things worsen again he will let us know.

## 2017-05-13 ENCOUNTER — Ambulatory Visit (INDEPENDENT_AMBULATORY_CARE_PROVIDER_SITE_OTHER): Payer: Medicare Other | Admitting: Ophthalmology

## 2017-05-13 DIAGNOSIS — H43813 Vitreous degeneration, bilateral: Secondary | ICD-10-CM

## 2017-05-13 DIAGNOSIS — I10 Essential (primary) hypertension: Secondary | ICD-10-CM | POA: Diagnosis not present

## 2017-05-13 DIAGNOSIS — D3131 Benign neoplasm of right choroid: Secondary | ICD-10-CM | POA: Diagnosis not present

## 2017-05-13 DIAGNOSIS — H35033 Hypertensive retinopathy, bilateral: Secondary | ICD-10-CM

## 2017-11-13 IMAGING — MR MR LUMBAR SPINE W/O CM
5 series · 43 of 48 positions shown · non-contrast
Comparison: Radiographs dated 06/28/2016 and PET-CT dated
07/16/2016

CLINICAL DATA: Low back pain.  History of prostate cancer.

EXAM:
MRI LUMBAR SPINE WITHOUT CONTRAST
TECHNIQUE: Multiplanar, multisequence MR imaging of the lumbar spine was
performed. No intravenous contrast was administered.

[Series 3: T2 · sagittal · 4.0mm · 0.94mm/px · 6 of 13 slices shown (1 of 2)]
[im 1/13]
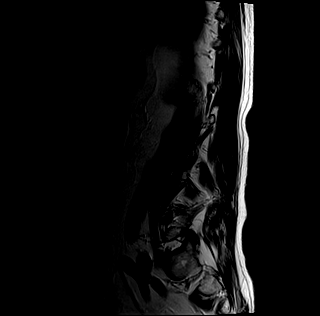
[im 3/13]
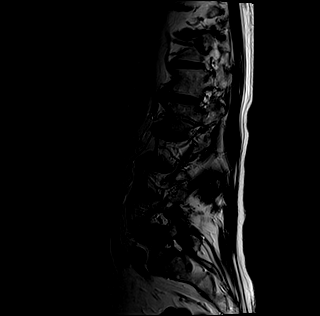
[im 5/13]
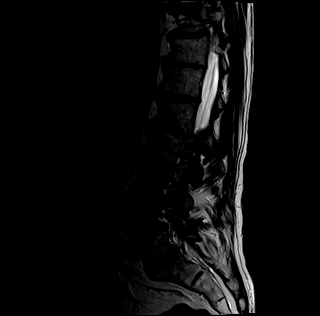
[im 8/13]
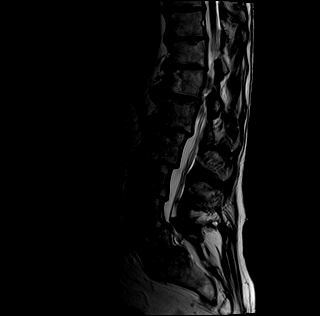
[im 10/13]
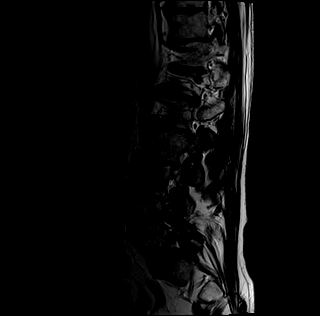
[im 13/13]
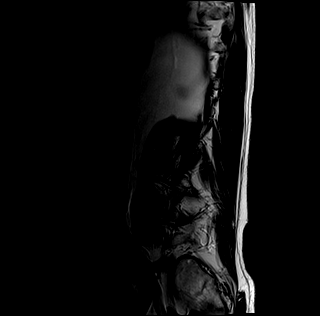

[Series 4: T1 · sagittal · 4.0mm · 0.94mm/px · 6 of 13 slices shown (1 of 2)]
[im 1/13]
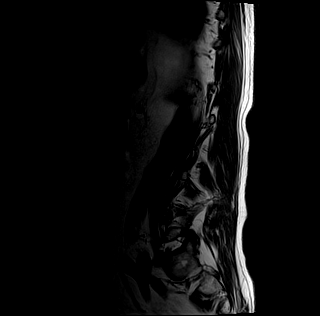
[im 3/13]
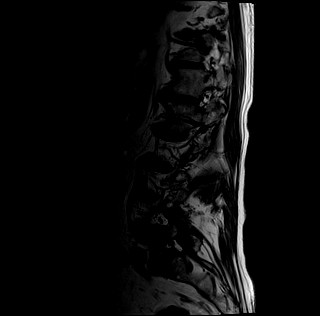
[im 5/13]
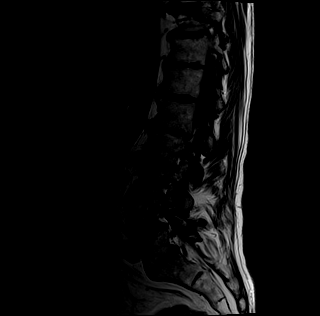
[im 8/13]
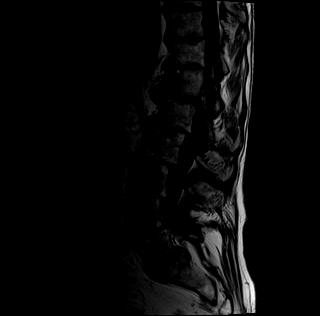
[im 10/13]
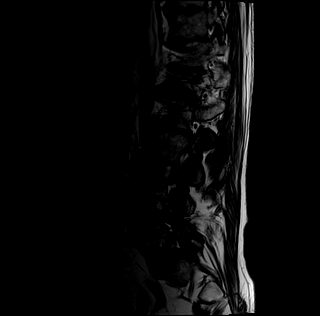
[im 13/13]
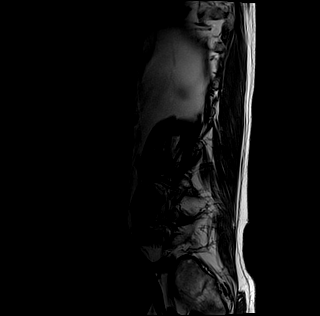

[Series 5: tirm sag · sagittal · 4.0mm · 0.59mm/px · 6 of 13 slices shown]
[im 1/13]
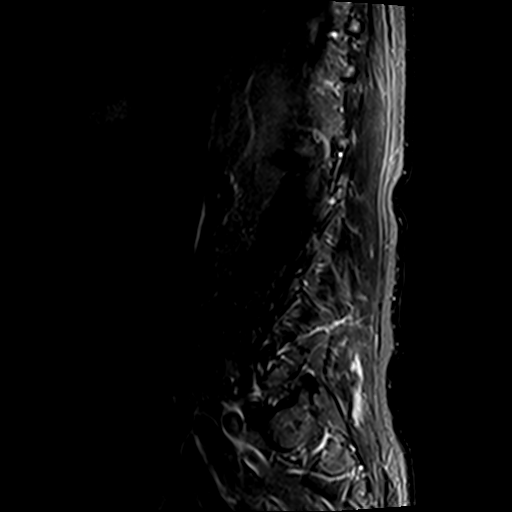
[im 3/13]
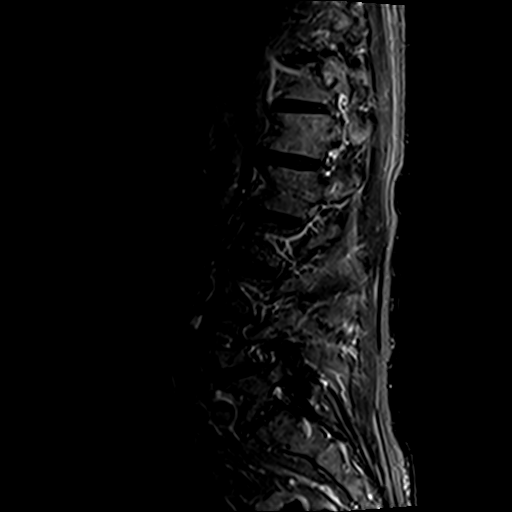
[im 5/13]
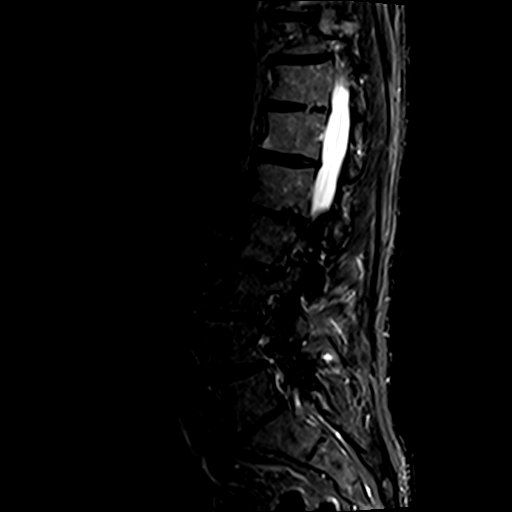
[im 8/13]
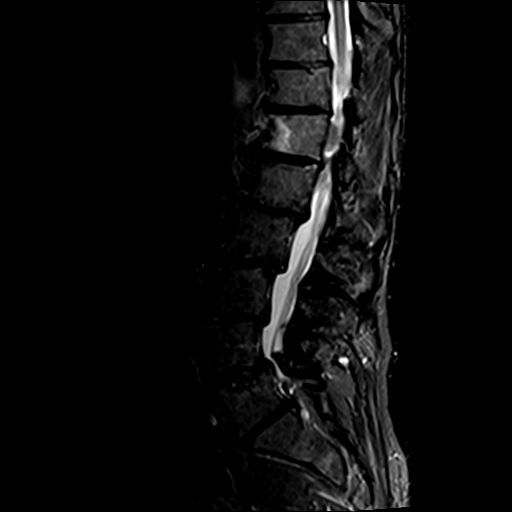
[im 10/13]
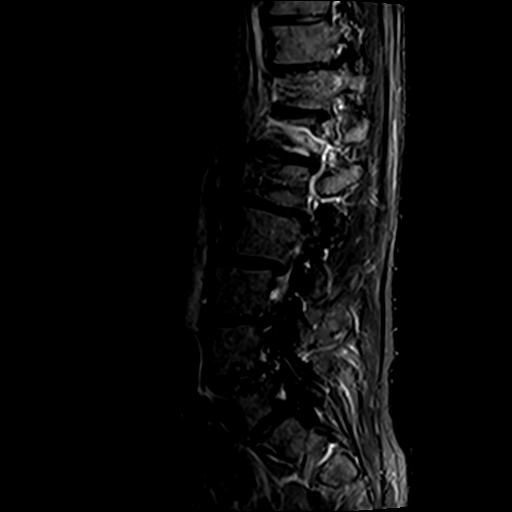
[im 13/13]
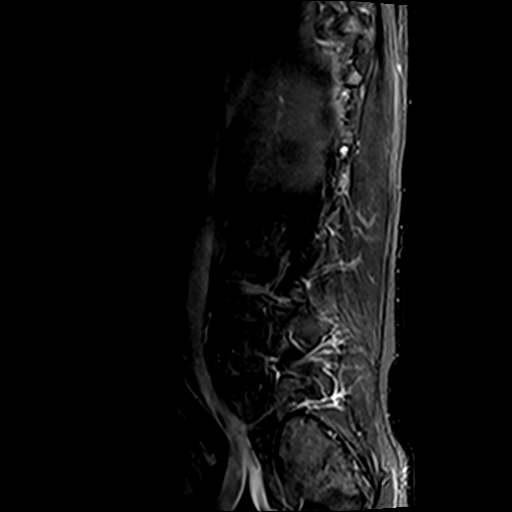

[Series 6: T1 · axial · 4.0mm · 0.78mm/px · z∈[-51,+127]mm · 10 of 31 slices shown (2 of 2)]
[im 1/31]
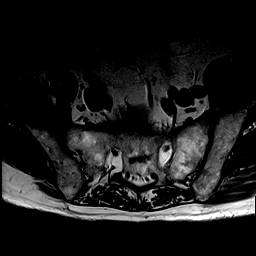
[im 3/31]
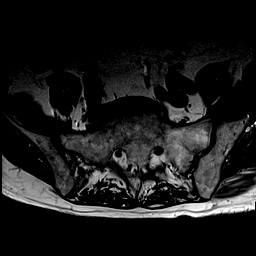
[im 5/31]
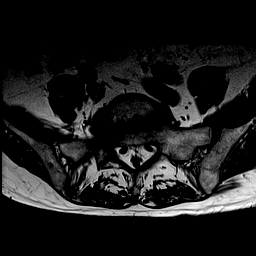
[im 9/31]
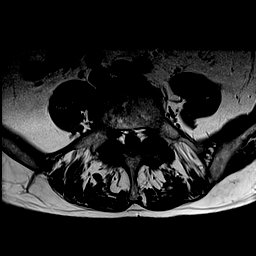
[im 13/31]
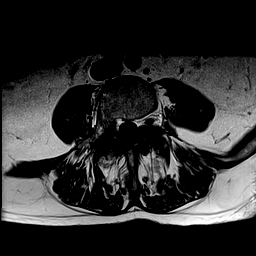
[im 16/31]
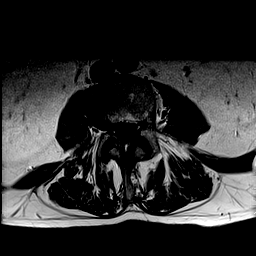
[im 18/31]
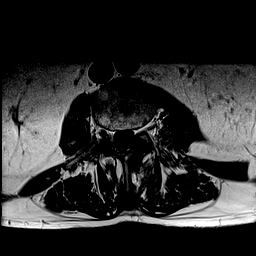
[im 22/31]
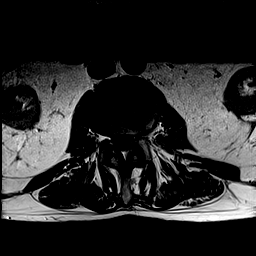
[im 26/31]
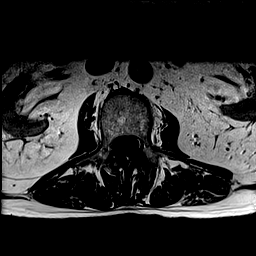
[im 31/31]
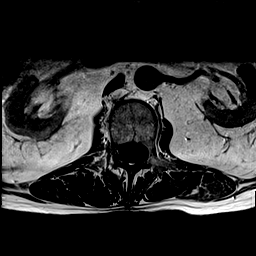

[Series 7: T2 · axial · 4.0mm · 0.78mm/px · z∈[-51,+127]mm · 15 of 31 slices shown (2 of 2)]
[im 1/31]
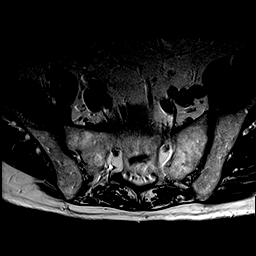
[im 3/31]
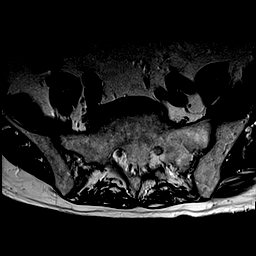
[im 5/31]
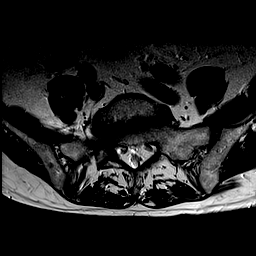
[im 7/31]
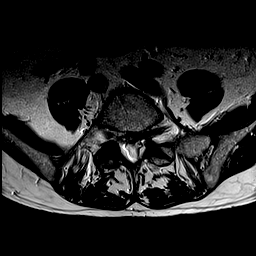
[im 9/31]
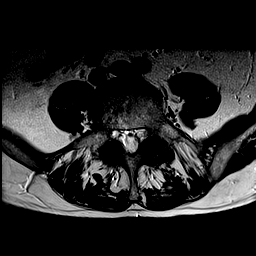
[im 11/31]
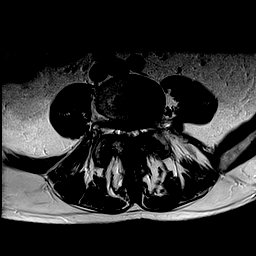
[im 13/31]
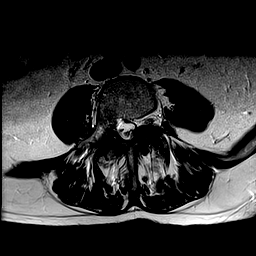
[im 16/31]
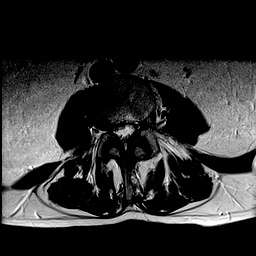
[im 18/31]
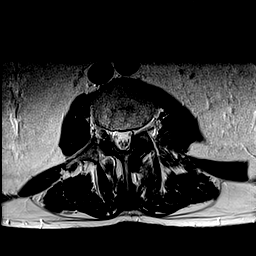
[im 20/31]
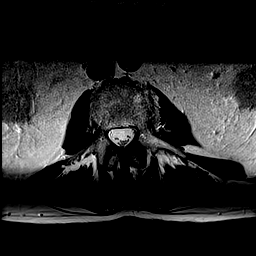
[im 22/31]
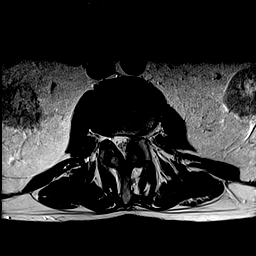
[im 24/31]
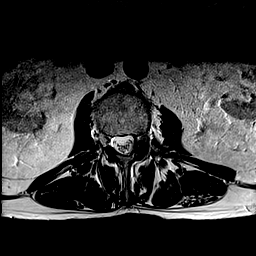
[im 26/31]
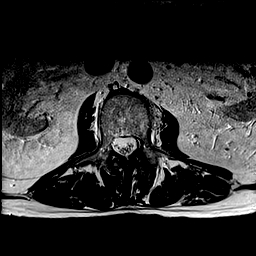
[im 28/31]
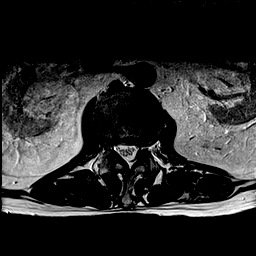
[im 31/31]
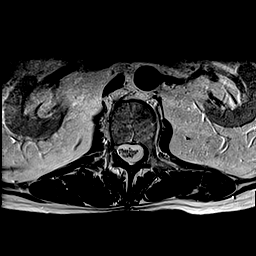

[43 of 48 positions shown; findings below may reference images not displayed]

FINDINGS: Segmentation:  Standard.

Alignment: Retrolisthesis at L1-2 and L2-3 and L3-4 and
anterolisthesis at L4-5 and L5-S1.

Vertebrae: Asymmetric osteophyte formation and degenerative changes
of the vertebra at T12 asymmetric to the right. There is no abnormal
activity at the site on PET-CT to suggest metastasis.

Conus medullaris: Extends to the T12-L1 level and appears normal.

Paraspinal and other soft tissues: Negative.

Disc levels:

L1-2: 6 mm retrolisthesis. No significant bulging of the uncovered
disc. Chronic disc space narrowing. Minimal narrowing of the spinal
canal without neural impingement.

L2-3: Chronic disc space narrowing. 5 mm retrolisthesis. No bulging
of the uncovered disc. Slight hypertrophy of the left ligamentum
flavum slightly narrows the left lateral recess. Slight spinal
stenosis without focal neural impingement.

L3-4: 3 mm retrolisthesis. Chronic disc space narrowing with no
bulging of the uncovered disc. Hypertrophy of the ligamentum flavum
facet joints creates mild spinal stenosis without focal neural
impingement.

L4-5: 4 mm spondylolisthesis with severe bilateral facet arthritis
with hypertrophy of the facet joints and ligamentum flavum combining
to create severe spinal stenosis. Broad-based bulge of the uncovered
disc. Severe right and slight left foraminal stenosis.

L5-S1: Chronic disc space narrowing. Broad-based bulge of the
uncovered disc without neural impingement. No foraminal stenosis.
IMPRESSION: Severe spinal stenosis at L4-5 due to a combination of facet
arthritis and spondylolisthesis and disc bulging.

Multilevel degenerative disc disease in the remainder of the lumbar
spine without focal neural impingement.

No findings of metastatic disease.

## 2018-05-19 ENCOUNTER — Encounter (INDEPENDENT_AMBULATORY_CARE_PROVIDER_SITE_OTHER): Payer: Medicare Other | Admitting: Ophthalmology

## 2018-05-19 DIAGNOSIS — I10 Essential (primary) hypertension: Secondary | ICD-10-CM | POA: Diagnosis not present

## 2018-05-19 DIAGNOSIS — H35033 Hypertensive retinopathy, bilateral: Secondary | ICD-10-CM | POA: Diagnosis not present

## 2018-05-19 DIAGNOSIS — D3131 Benign neoplasm of right choroid: Secondary | ICD-10-CM

## 2018-05-19 DIAGNOSIS — H43813 Vitreous degeneration, bilateral: Secondary | ICD-10-CM

## 2019-01-11 ENCOUNTER — Other Ambulatory Visit: Payer: Self-pay | Admitting: *Deleted

## 2019-01-11 DIAGNOSIS — Z20822 Contact with and (suspected) exposure to covid-19: Secondary | ICD-10-CM

## 2019-01-12 LAB — NOVEL CORONAVIRUS, NAA: SARS-CoV-2, NAA: NOT DETECTED

## 2019-05-21 ENCOUNTER — Encounter (INDEPENDENT_AMBULATORY_CARE_PROVIDER_SITE_OTHER): Payer: Medicare Other | Admitting: Ophthalmology

## 2019-08-05 ENCOUNTER — Other Ambulatory Visit: Payer: Self-pay | Admitting: Urology

## 2019-08-05 DIAGNOSIS — R9721 Rising PSA following treatment for malignant neoplasm of prostate: Secondary | ICD-10-CM

## 2019-08-09 ENCOUNTER — Other Ambulatory Visit: Payer: Self-pay

## 2019-08-09 ENCOUNTER — Encounter (INDEPENDENT_AMBULATORY_CARE_PROVIDER_SITE_OTHER): Payer: Medicare Other | Admitting: Ophthalmology

## 2019-08-09 DIAGNOSIS — H35033 Hypertensive retinopathy, bilateral: Secondary | ICD-10-CM | POA: Diagnosis not present

## 2019-08-09 DIAGNOSIS — I10 Essential (primary) hypertension: Secondary | ICD-10-CM

## 2019-08-09 DIAGNOSIS — D3131 Benign neoplasm of right choroid: Secondary | ICD-10-CM | POA: Diagnosis not present

## 2019-08-09 DIAGNOSIS — H43813 Vitreous degeneration, bilateral: Secondary | ICD-10-CM

## 2019-08-11 ENCOUNTER — Encounter (HOSPITAL_COMMUNITY)
Admission: RE | Admit: 2019-08-11 | Discharge: 2019-08-11 | Disposition: A | Discharge status: Home / Self Care | Payer: Medicare Other | Source: Ambulatory Visit | Attending: Urology | Admitting: Urology

## 2019-08-11 ENCOUNTER — Other Ambulatory Visit: Payer: Self-pay

## 2019-08-11 DIAGNOSIS — R9721 Rising PSA following treatment for malignant neoplasm of prostate: Secondary | ICD-10-CM

## 2019-08-11 MED ORDER — TECHNETIUM TC 99M MEDRONATE IV KIT
21.4000 | PACK | Freq: Once | INTRAVENOUS | Status: AC
  Administered 2019-08-11: 21.4 via INTRAVENOUS

## 2019-11-25 ENCOUNTER — Other Ambulatory Visit: Payer: Self-pay | Admitting: Urology

## 2019-11-25 DIAGNOSIS — M8580 Other specified disorders of bone density and structure, unspecified site: Secondary | ICD-10-CM

## 2020-02-16 DIAGNOSIS — G459 Transient cerebral ischemic attack, unspecified: Secondary | ICD-10-CM

## 2020-02-16 DIAGNOSIS — G40119 Localization-related (focal) (partial) symptomatic epilepsy and epileptic syndromes with simple partial seizures, intractable, without status epilepticus: Secondary | ICD-10-CM

## 2020-02-16 HISTORY — DX: Transient cerebral ischemic attack, unspecified: G45.9

## 2020-02-16 HISTORY — DX: Localization-related (focal) (partial) symptomatic epilepsy and epileptic syndromes with simple partial seizures, intractable, without status epilepticus: G40.119

## 2020-03-07 ENCOUNTER — Other Ambulatory Visit: Payer: Medicare Other

## 2020-03-12 ENCOUNTER — Encounter (HOSPITAL_COMMUNITY): Payer: Self-pay

## 2020-03-12 ENCOUNTER — Observation Stay (HOSPITAL_COMMUNITY)
Admission: EM | Admit: 2020-03-12 | Discharge: 2020-03-13 | Disposition: A | Discharge status: Home / Self Care | Payer: Medicare Other | Attending: Internal Medicine | Admitting: Internal Medicine

## 2020-03-12 ENCOUNTER — Emergency Department (HOSPITAL_COMMUNITY): Payer: Medicare Other

## 2020-03-12 DIAGNOSIS — I10 Essential (primary) hypertension: Secondary | ICD-10-CM | POA: Diagnosis not present

## 2020-03-12 DIAGNOSIS — R9431 Abnormal electrocardiogram [ECG] [EKG]: Secondary | ICD-10-CM | POA: Diagnosis present

## 2020-03-12 DIAGNOSIS — Z20822 Contact with and (suspected) exposure to covid-19: Secondary | ICD-10-CM | POA: Diagnosis not present

## 2020-03-12 DIAGNOSIS — I5032 Chronic diastolic (congestive) heart failure: Secondary | ICD-10-CM | POA: Diagnosis present

## 2020-03-12 DIAGNOSIS — R4701 Aphasia: Secondary | ICD-10-CM | POA: Diagnosis not present

## 2020-03-12 DIAGNOSIS — H60392 Other infective otitis externa, left ear: Secondary | ICD-10-CM | POA: Diagnosis not present

## 2020-03-12 DIAGNOSIS — Z79899 Other long term (current) drug therapy: Secondary | ICD-10-CM | POA: Insufficient documentation

## 2020-03-12 DIAGNOSIS — N1831 Chronic kidney disease, stage 3a: Secondary | ICD-10-CM | POA: Diagnosis present

## 2020-03-12 DIAGNOSIS — C61 Malignant neoplasm of prostate: Secondary | ICD-10-CM | POA: Diagnosis present

## 2020-03-12 DIAGNOSIS — H6012 Cellulitis of left external ear: Secondary | ICD-10-CM | POA: Diagnosis present

## 2020-03-12 DIAGNOSIS — E785 Hyperlipidemia, unspecified: Secondary | ICD-10-CM | POA: Diagnosis present

## 2020-03-12 DIAGNOSIS — N183 Chronic kidney disease, stage 3 unspecified: Secondary | ICD-10-CM | POA: Diagnosis present

## 2020-03-12 LAB — DIFFERENTIAL
Abs Immature Granulocytes: 0.01 10*3/uL (ref 0.00–0.07)
Basophils Absolute: 0.1 10*3/uL (ref 0.0–0.1)
Basophils Relative: 1 %
Eosinophils Absolute: 0.2 10*3/uL (ref 0.0–0.5)
Eosinophils Relative: 4 %
Immature Granulocytes: 0 %
Lymphocytes Relative: 24 %
Lymphs Abs: 1.2 10*3/uL (ref 0.7–4.0)
Monocytes Absolute: 0.6 10*3/uL (ref 0.1–1.0)
Monocytes Relative: 12 %
Neutro Abs: 3 10*3/uL (ref 1.7–7.7)
Neutrophils Relative %: 59 %

## 2020-03-12 LAB — COMPREHENSIVE METABOLIC PANEL WITH GFR
ALT: 17 U/L (ref 0–44)
AST: 27 U/L (ref 15–41)
Albumin: 3.8 g/dL (ref 3.5–5.0)
Alkaline Phosphatase: 99 U/L (ref 38–126)
Anion gap: 11 (ref 5–15)
BUN: 28 mg/dL — ABNORMAL HIGH (ref 8–23)
CO2: 25 mmol/L (ref 22–32)
Calcium: 10.6 mg/dL — ABNORMAL HIGH (ref 8.9–10.3)
Chloride: 101 mmol/L (ref 98–111)
Creatinine, Ser: 1.53 mg/dL — ABNORMAL HIGH (ref 0.61–1.24)
GFR, Estimated: 44 mL/min — ABNORMAL LOW
Glucose, Bld: 124 mg/dL — ABNORMAL HIGH (ref 70–99)
Potassium: 3.8 mmol/L (ref 3.5–5.1)
Sodium: 137 mmol/L (ref 135–145)
Total Bilirubin: 0.7 mg/dL (ref 0.3–1.2)
Total Protein: 7 g/dL (ref 6.5–8.1)

## 2020-03-12 LAB — APTT: aPTT: 31 s (ref 24–36)

## 2020-03-12 LAB — PROTIME-INR
INR: 1.1 (ref 0.8–1.2)
Prothrombin Time: 13.5 s (ref 11.4–15.2)

## 2020-03-12 LAB — CBC
HCT: 41.8 % (ref 39.0–52.0)
Hemoglobin: 13.6 g/dL (ref 13.0–17.0)
MCH: 30.6 pg (ref 26.0–34.0)
MCHC: 32.5 g/dL (ref 30.0–36.0)
MCV: 93.9 fL (ref 80.0–100.0)
Platelets: 216 10*3/uL (ref 150–400)
RBC: 4.45 MIL/uL (ref 4.22–5.81)
RDW: 13.8 % (ref 11.5–15.5)
WBC: 5.1 10*3/uL (ref 4.0–10.5)
nRBC: 0 % (ref 0.0–0.2)

## 2020-03-12 MED ORDER — SODIUM CHLORIDE 0.9% FLUSH
3.0000 mL | Freq: Once | INTRAVENOUS | Status: AC
  Administered 2020-03-12: 3 mL via INTRAVENOUS

## 2020-03-12 MED ORDER — LEVETIRACETAM IN NACL 1500 MG/100ML IV SOLN
1500.0000 mg | Freq: Once | INTRAVENOUS | Status: AC
  Administered 2020-03-12: 22:00:00 1500 mg via INTRAVENOUS
  Filled 2020-03-12: qty 100

## 2020-03-12 NOTE — ED Notes (Signed)
PT back in room from MRI

## 2020-03-12 NOTE — ED Notes (Signed)
Pt to ED 35. Wife not at bedside. Pt resting in bed. NADN. Disoriented w/ AMS

## 2020-03-12 NOTE — ED Notes (Signed)
PT in MRI.

## 2020-03-12 NOTE — ED Notes (Signed)
Pt resting in bed. NADN. Awaiting MRI. Son at bedside.

## 2020-03-12 NOTE — ED Provider Notes (Signed)
[facility] EMERGENCY DEPARTMENT Provider Note   CSN: [id number] Arrival date & time: 03/12/20  1408     History No chief complaint on file.   Colin Lewis is a 86 y.o. male.  HPI Patient was last seen normal approximately 2 PM yesterday at that time his speech was normal and no signs of incoordination.  The patient son has been in his house and all family members were together at that time celebrating Christmas.  Patient son reports that his mother also has some cognitive impairment, and thus there is not a clear time as to when this change occurred.  EMS reports that the patient's wife reported that he had not been acting right "this weekend".  Patient son however reports that he was normal up until he left at 2 PM yesterday.  Main symptom is disorganized speech pattern.  Patient appears to be aware of the problem he knows or something wrong but he cannot describe exactly what it is.  Speech is clear but will then contain random inappropriate words and phrases.  He cannot seem to generate names of objects.  He is repeating back to me rather than answering a question.  Patient does point out that he has a problem with his left ear.  He had a hard time describing what it was but his son clarified that he has had skin cancers and had some removed a few weeks ago.  He reports this week is really continue to bother him and be uncomfortable.    Past Medical History:  Diagnosis Date   History of kidney stones    History of urinary tract obstruction    Hyperlipidemia    Hypertension    Prostate cancer [facility])    Thoracic spondylosis    Wears glasses     Patient Active Problem List   Diagnosis Date Noted   Chronic right-sided low back pain with right-sided sciatica 07/24/2016    Past Surgical History:  Procedure Laterality Date   CATARACT EXTRACTION W/ INTRAOCULAR LENS  IMPLANT, BILATERAL  2013   CRYOABLATION N/A 09/13/2013   Procedure: CRYO ABLATION PROSTATE;   Surgeon: Ailene Rud, MD;  Location: [facility];  Service: Urology;  Laterality: N/A;   CYSTO/  RIGHT RETROGRADE PYELOGRAM/  RIGHT URETERAL STENT PLACEMENT  01-18-2001   CYSTO/ RIGHT RETROGRADE PYELOGRAM/ RIGHT URETEROSCOPIC LASER LITHOTRIPSY STONE EXTRACTION / STENT PLACEMENT  10-28-2007   DILATATION MEATAL STENOSIS/  CYSTOSCOPY/  LEFT RETROGRADE PYELOGRAM/ LEFT URETEROSOPIC LASER LITHOTRIPSY STONE EXTRACTION / STENT PLACEMENT  10-04-1999   EXTRACORPOREAL SHOCK WAVE LITHOTRIPSY Left 05-18-2012   LEFT URETEROSCOPIC STONE EXTRACTION  10-13-2001 &  04-10-2007       No family history on file.  Social History   Tobacco Use   Smoking status: Never Smoker   Smokeless tobacco: Never Used  Substance Use Topics   Alcohol use: No   Drug use: No    Home Medications Prior to Admission medications   Medication Sig Start Date End Date Taking? Authorizing Provider  albuterol (VENTOLIN HFA) 108 (90 Base) MCG/ACT inhaler Inhale into the lungs every 6 (six) hours as needed for wheezing or shortness of breath.   Yes [provider]  Calcium Carbonate (CALCIUM 600 PO) Take 600 mg by mouth daily.   Yes [provider]  cholecalciferol (VITAMIN D3) 25 MCG (1000 UNIT) tablet Take 1,000 Units by mouth daily.   Yes [provider]  enzalutamide (XTANDI) 40 MG tablet Take 160 mg by  mouth daily.   Yes [provider]  loratadine (CLARITIN) 10 MG tablet Take 10 mg by mouth daily as needed for allergies.   Yes [provider]  Magnesium 400 MG TABS Take 400 mg by mouth daily.   Yes [provider]  mometasone-formoterol (DULERA) 100-5 MCG/ACT AERO Inhale 2 puffs into the lungs 2 (two) times daily.   Yes [provider]  simvastatin (ZOCOR) 20 MG tablet Take 20 mg by mouth daily.   Yes [provider]  telmisartan (MICARDIS) 40 MG tablet Take 40 mg by mouth daily. 09/29/19  Yes [provider]   venlafaxine (EFFEXOR) 75 MG tablet Take 75 mg by mouth at bedtime. For hot flashes 02/21/20  Yes [provider]    Allergies    Patient has no known allergies.  Review of Systems   Review of Systems Level 5 caveat cannot obtain review of systems due to disorganized speech.  Physical Exam Updated Vital Signs BP 135/77    Pulse 95    Temp 97.8 F (36.6 C) (Oral)    Resp 11    SpO2 95%   Physical Exam Constitutional:      Comments: Patient is alert.  He does not have a confused appearance.  No respiratory distress.  HENT:     Head:     Comments: No facial trauma.  Large swelling in wound of the left ear as imaged.  Bilateral ear canals are clear.  Bilateral TMs have tympanosclerosis but no erythema or bulging.    Nose: Nose normal.     Mouth/Throat:     Mouth: Mucous membranes are moist.     Pharynx: Oropharynx is clear.  Eyes:     Extraocular Movements: Extraocular movements intact.     Conjunctiva/sclera: Conjunctivae normal.     Pupils: Pupils are equal, round, and reactive to light.  Cardiovascular:     Rate and Rhythm: Normal rate and regular rhythm.  Pulmonary:     Effort: Pulmonary effort is normal.     Breath sounds: Normal breath sounds.  Abdominal:     General: There is no distension.     Palpations: Abdomen is soft.     Tenderness: There is no abdominal tenderness. There is no guarding.  Musculoskeletal:        General: No swelling or tenderness. Normal range of motion.     Cervical back: Neck supple.     Right lower leg: No edema.     Left lower leg: No edema.  Skin:    General: Skin is warm and dry.  Neurological:     Comments: Patient is not somnolent.  Speech is grossly clear but has irregular patterns and inability to generate normal sentences.  Patient will try to begin a sentence and then garbled words follow.  Other times he repeats what I say and cannot generate a new sentence.  Occasionally he generates a normal sentence but cannot have a  normal conversation.  He does seem to follow commands appropriately.  Most suggestive of expressive aphasia without receptive aphasia no appreciable focal motor deficit on the upper or lower extremities.  Patient will perform grip strength, hold arms in extension, elevate lower extremities and hold against resistance without difficulty.  However asked to differentiate left from right with light touch of the arms patient, patient cannot say the words left or right.  Psychiatric:        Mood and Affect: Mood normal.  ED Results / Procedures / Treatments   Labs (all labs ordered are listed, but only abnormal results are displayed) Labs Reviewed  COMPREHENSIVE METABOLIC PANEL - Abnormal; Notable for the following components:      Result Value   Glucose, Bld 124 (*)    BUN 28 (*)    Creatinine, Ser 1.53 (*)    Calcium 10.6 (*)    GFR, Estimated 44 (*)    All other components within normal limits  RESP PANEL BY RT-PCR (FLU A&B, COVID) ARPGX2  PROTIME-INR  APTT  CBC  DIFFERENTIAL  CBG MONITORING, ED    EKG EKG Interpretation  Date/Time:  Sunday March 12 2020 14:09:54 EST Ventricular Rate:  88 PR Interval:  182 QRS Duration: 92 QT Interval:  424 QTC Calculation: 513 R Axis:   -86 Text Interpretation: Normal sinus rhythm Incomplete right bundle branch block Left anterior fascicular block Cannot rule out Anterior infarct , age undetermined Prolonged QT no sig change from previous Confirmed by Charlesetta Shanks [phone]) on 03/12/2020 5:24:37 PM   Radiology CT HEAD WO CONTRAST  Result Date: 03/12/2020 CLINICAL DATA:  Altered mental status EXAM: CT HEAD WITHOUT CONTRAST TECHNIQUE: Contiguous axial images were obtained from the base of the skull through the vertex without intravenous contrast. COMPARISON:  None. FINDINGS: Brain: No evidence of acute infarction, hemorrhage, hydrocephalus, extra-axial collection or mass lesion/mass effect. Scattered low-density changes within  the periventricular and subcortical white matter compatible with chronic microvascular ischemic change. Mild diffuse cerebral volume loss. Vascular: Atherosclerotic calcifications involving the large vessels of the skull base. No unexpected hyperdense vessel. Skull: Normal. Negative for fracture or focal lesion. Sinuses/Orbits: No acute finding. Other: None. IMPRESSION: 1. No acute intracranial findings. 2. Mild chronic microvascular ischemic change and cerebral volume loss. Electronically Signed   By: Davina Poke D.O.   On: 03/12/2020 15:13   MR Brain Wo Contrast (neuro protocol)  Result Date: 03/12/2020 CLINICAL DATA:  Mental status change. EXAM: MRI HEAD WITHOUT CONTRAST TECHNIQUE: Multiplanar, multiecho pulse sequences of the brain and surrounding structures were obtained without intravenous contrast. COMPARISON:  Head CT 03/12/2020 FINDINGS: The study is mildly motion degraded. Brain: There is no evidence of an acute infarct, intracranial hemorrhage, mass, midline shift, or extra-axial fluid collection. Scattered small foci of T2 hyperintensity are present in the cerebral white matter bilaterally, nonspecific though most often seen with chronic small vessel ischemia. A subcentimeter chronic right cerebellar infarct is noted. There is mild generalized cerebral atrophy. Vascular: Major intracranial vascular flow voids are preserved. Skull and upper cervical spine: Unremarkable bone marrow signal. Sinuses/Orbits: Bilateral cataract extraction. Minimal bilateral ethmoid air cell mucosal thickening. Clear mastoid air cells. Other: None. IMPRESSION: 1. No acute intracranial abnormality. 2. Mild chronic small vessel ischemic disease and cerebral atrophy. 3. Small chronic right cerebellar infarct. Electronically Signed   By: Logan Bores M.D.   On: 03/12/2020 19:31    Procedures Procedures (including critical care time)  Medications Ordered in ED Medications  sodium chloride flush (NS) 0.9 % injection  3 mL (3 mLs Intravenous Given 03/12/20 1745)  levETIRAcetam (KEPPRA) IVPB 1500 mg/ 100 mL premix (0 mg Intravenous Stopped 03/12/20 2209)    ED Course  I have reviewed the triage vital signs and the nursing notes.  Pertinent labs & imaging results that were available during my care of the patient were reviewed by me and considered in my medical decision making (see chart for details).  Clinical Course as of 03/12/20 2343  Nancy Fetter Mar 12, 2020  2028 Consult: Neurology.  Will assess the patient in the emergency department. [MP]    Clinical Course User Index [MP] Charlesetta Shanks, MD   MDM Rules/Calculators/A&P                         Patient presents as outlined with expressive aphasia.  He does not have focal motor deficits.  Per his son, this started sometime after 2 PM yesterday.  Patient has been seen by neurology.  MRI with contrast has been added.  On exam patient does have appearance of a infection of the left pinna.  However, this may have an element of chronicity.  Patient has had superficial skin cancers removed and his son reports that they had a biopsy or removal on that ear.  He reports it has been bothering him a lot more lately however, patient does not have lactic acidosis, fever or leukocytosis to correlate with significant infection as the underlying cause of his expressive aphasia.  He is nontoxic in appearance.  Neurology has started patient on a Keppra bolus for possible partial seizure type presentation and is proceeding with contrasted MRI.  Plan for admission to medical service for continued evaluation and work-up.  Final Clinical Impression(s) / ED Diagnoses Final diagnoses:  Expressive aphasia  Acute infection of left pinna    Rx / DC Orders ED Discharge Orders    None       Charlesetta Shanks, MD 03/12/20 2345

## 2020-03-12 NOTE — ED Triage Notes (Signed)
Patient arrived by [facility] from home and spouse reports that patient not acting right this weekend. Follows commands but answers only yes or I dont know to questions. Alert and no drift nor facial droop noted

## 2020-03-12 NOTE — Consult Note (Addendum)
NEUROLOGY CONSULTATION NOTE   Date of service: March 12, 2020 Patient Name: Colin Lewis MRN:  [id number] DOB:  [birth year] Reason for consult: "persistent aphasia" _ _ _   _ __   _ __ _ _  __ __   _ __   __ _  History of Present Illness  Colin Lewis is a 86 y.o. male with PMH significant for HTN, HLD, Prostrate Cancer on Xtandi, hx of melanoma per patient with excision of a few scalp lesions last year(no documentation however) and excision of left pinna lesion last month who presents with aphasia.  Patient's son is at bedside and provides most of the history. Patient lives with his wife who has dementia. They noted that he had aphasia in AM today. Patient reports that he never went to bed last night, feels that this started about 20 hours ago but due to his aphasia, it is difficult to get much information out of him.   ROS   Unable to obtain a detailed ROS due to aphasia. No pain.  Past History   Past Medical History:  Diagnosis Date  . History of kidney stones   . History of urinary tract obstruction   . Hyperlipidemia   . Hypertension   . Prostate cancer ([place])   . Thoracic spondylosis   . Wears glasses    Past Surgical History:  Procedure Laterality Date  . CATARACT EXTRACTION W/ INTRAOCULAR LENS  IMPLANT, BILATERAL  2013  . CRYOABLATION N/A 09/13/2013   Procedure: CRYO ABLATION PROSTATE;  Surgeon: Ailene Rud, MD;  Location: [facility];  Service: Urology;  Laterality: N/A;  . CYSTO/  RIGHT RETROGRADE PYELOGRAM/  RIGHT URETERAL STENT PLACEMENT  01-18-2001  . CYSTO/ RIGHT RETROGRADE PYELOGRAM/ RIGHT URETEROSCOPIC LASER LITHOTRIPSY STONE EXTRACTION / STENT PLACEMENT  10-28-2007  . DILATATION MEATAL STENOSIS/  CYSTOSCOPY/  LEFT RETROGRADE PYELOGRAM/ LEFT URETEROSOPIC LASER LITHOTRIPSY STONE EXTRACTION / STENT PLACEMENT  10-04-1999  . EXTRACORPOREAL SHOCK WAVE LITHOTRIPSY Left 05-18-2012  . LEFT URETEROSCOPIC STONE EXTRACTION  10-13-2001 &  04-10-2007    No family history on file. Social History   Socioeconomic History  . Marital status: Married    Spouse name: Not on file  . Number of children: Not on file  . Years of education: Not on file  . Highest education level: Not on file  Occupational History  . Not on file  Tobacco Use  . Smoking status: Never Smoker  . Smokeless tobacco: Never Used  Substance and Sexual Activity  . Alcohol use: No  . Drug use: No  . Sexual activity: Not on file  Other Topics Concern  . Not on file  Social History Narrative  . Not on file   Social Determinants of Health   Financial Resource Strain: Not on file  Food Insecurity: Not on file  Transportation Needs: Not on file  Physical Activity: Not on file  Stress: Not on file  Social Connections: Not on file   No Known Allergies  Medications  (Not in a hospital admission)    Vitals   Vitals:   03/12/20 2115 03/12/20 2130 03/12/20 2145 03/12/20 2200  BP: (!) 151/87 (!) 151/87 (!) 160/90 (!) 170/74  Pulse: 95 96 93 96  Resp: (!) '23 12 17 ' (!) 21  Temp:      TempSrc:      SpO2: 95% 90% 94% 96%     There is no height or weight on file to calculate BMI.  Physical Exam  General: Laying comfortably in bed; in no acute distress. HENT: Normal oropharynx and mucosa. Normal external appearance of ears and nose. Neck: Supple, no pain or tenderness CV: No JVD. No peripheral edema. Pulmonary: Symmetric Chest rise. Normal respiratory effort. Abdomen: Soft to touch, non-tender. Ext: No cyanosis, edema, or deformity Skin: No rash. Normal palpation of skin.  Musculoskeletal: Normal digits and nails by inspection. No clubbing.  Neurologic Examination  Mental status/Cognition: Alert, oriented to self, but not to place, oriented to year but not month, good attention. Speech/language: Non Fluent, comprehension intermittently intact, makes mistakes when attempting to name object, unable to repeat.  Cranial nerves:   CN II Pupils equal and  reactive to light, no VF deficits   CN III,IV,VI EOM intact, no gaze preference or deviation, no nystagmus   CN V normal sensation in V1, V2, and V3 segments bilaterally   CN VII no asymmetry, no nasolabial fold flattening   CN VIII normal hearing to speech   CN IX & X normal palatal elevation, no uvular deviation   CN XI 5/5 head turn and 5/5 shoulder shrug bilaterally   CN XII midline tongue protrusion   Motor:  Muscle bulk: normal, tone normal, pronator drift none tremor none Mvmt Root Nerve  Muscle Right Left Comments  SA C5/6 Ax Deltoid     EF C5/6 Mc Biceps 5 5   EE C6/7/8 Rad Triceps 5 5   WF C6/7 Med FCR 5 5   WE C7/8 PIN ECU 5 5   F Ab C8/T1 U ADM/FDI 5 5   HF L1/2/3 Fem Illopsoas 5 5   KE L2/3/4 Fem Quad 5 5   DF L4/5 D Peron Tib Ant 5 5   PF S1/2 Tibial Grc/Sol 5 5    Reflexes:  Right Left Comments  Pectoralis      Biceps (C5/6) 2 2   Brachioradialis (C5/6) 2 2    Triceps (C6/7) 2 2    Patellar (L3/4) 3 3    Achilles (S1) 1 1    Hoffman      Plantar     Jaw jerk    Sensation:  Light touch Intact throughout   Pin prick    Temperature    Vibration   Proprioception    Coordination/Complex Motor:  - Finger to Nose intact BL - Heel to shin unable to assess due to aphasia but appears intact. - Rapid alternating movement are normal - Gait: Deferred.  Labs   CBC:  Recent Labs  Lab 03/12/20 1439  WBC 5.1  NEUTROABS 3.0  HGB 13.6  HCT 41.8  MCV 93.9  PLT 782    Basic Metabolic Panel:  Lab Results  Component Value Date   NA 137 03/12/2020   K 3.8 03/12/2020   CO2 25 03/12/2020   GLUCOSE 124 (H) 03/12/2020   BUN 28 (H) 03/12/2020   CREATININE 1.53 (H) 03/12/2020   CALCIUM 10.6 (H) 03/12/2020   GFRNONAA 44 (L) 03/12/2020   GFRAA (L) 10/28/2007    40        The eGFR has been calculated using the MDRD equation. This calculation has not been validated in all clinical   Lipid Panel: No results found for: LDLCALC HgbA1c: No results found for:  HGBA1C Urine Drug Screen: No results found for: LABOPIA, COCAINSCRNUR, LABBENZ, AMPHETMU, THCU, LABBARB  Alcohol Level No results found for: ETH  CT Head without contrast: CTH was negative for a large hypodensity concerning for a large territory infarct or  hyperdensity concerning for an [place]  MRI Brain  1. No acute intracranial abnormality. 2. Mild chronic small vessel ischemic disease and cerebral atrophy. 3. Small chronic right cerebellar infarct  rEEG: pending  Impression   AYDIN HINK is a 86 y.o. male who presents with aphasia. MRI Brain negative for a stroke. Neuro exam with expressive > sensory aphasia but no other focal deficit. Given reported hx of melanoma, will get MRI Brain with contrast and get a routine EEG to rule out for any CNS mets or epileptic aphasia.  Recommendations  - rEEG - MRI Brain with contrast. - Seizure precautions - Keppra 10m/Kg loaded ______________________________________________________________________   Thank you for the opportunity to take part in the care of this patient. If you have any further questions, please contact the neurology consultation attending.  Signed,  SParksidePager Number [phone]_ _ _   _ __   _ __ _ _  __ __   _ __   __ _

## 2020-03-13 ENCOUNTER — Observation Stay (HOSPITAL_BASED_OUTPATIENT_CLINIC_OR_DEPARTMENT_OTHER): Payer: Medicare Other

## 2020-03-13 ENCOUNTER — Observation Stay (HOSPITAL_COMMUNITY): Payer: Medicare Other

## 2020-03-13 DIAGNOSIS — I10 Essential (primary) hypertension: Secondary | ICD-10-CM | POA: Diagnosis present

## 2020-03-13 DIAGNOSIS — N183 Chronic kidney disease, stage 3 unspecified: Secondary | ICD-10-CM | POA: Diagnosis present

## 2020-03-13 DIAGNOSIS — R4701 Aphasia: Secondary | ICD-10-CM | POA: Diagnosis present

## 2020-03-13 DIAGNOSIS — C61 Malignant neoplasm of prostate: Secondary | ICD-10-CM | POA: Diagnosis present

## 2020-03-13 DIAGNOSIS — I5032 Chronic diastolic (congestive) heart failure: Secondary | ICD-10-CM | POA: Diagnosis not present

## 2020-03-13 DIAGNOSIS — H60392 Other infective otitis externa, left ear: Secondary | ICD-10-CM

## 2020-03-13 DIAGNOSIS — E785 Hyperlipidemia, unspecified: Secondary | ICD-10-CM | POA: Diagnosis present

## 2020-03-13 DIAGNOSIS — R9431 Abnormal electrocardiogram [ECG] [EKG]: Secondary | ICD-10-CM | POA: Diagnosis present

## 2020-03-13 DIAGNOSIS — H6012 Cellulitis of left external ear: Secondary | ICD-10-CM | POA: Diagnosis present

## 2020-03-13 DIAGNOSIS — N1831 Chronic kidney disease, stage 3a: Secondary | ICD-10-CM | POA: Diagnosis present

## 2020-03-13 LAB — COMPREHENSIVE METABOLIC PANEL
ALT: 15 U/L (ref 0–44)
AST: 34 U/L (ref 15–41)
Albumin: 3.4 g/dL — ABNORMAL LOW (ref 3.5–5.0)
Alkaline Phosphatase: 91 U/L (ref 38–126)
Anion gap: 9 (ref 5–15)
BUN: 24 mg/dL — ABNORMAL HIGH (ref 8–23)
CO2: 27 mmol/L (ref 22–32)
Calcium: 10.3 mg/dL (ref 8.9–10.3)
Chloride: 103 mmol/L (ref 98–111)
Creatinine, Ser: 1.47 mg/dL — ABNORMAL HIGH (ref 0.61–1.24)
GFR, Estimated: 46 mL/min — ABNORMAL LOW (ref >60)
Glucose, Bld: 106 mg/dL — ABNORMAL HIGH (ref 70–99)
Potassium: 3.5 mmol/L (ref 3.5–5.1)
Sodium: 139 mmol/L (ref 135–145)
Total Bilirubin: 0.7 mg/dL (ref 0.3–1.2)
Total Protein: 6.3 g/dL — ABNORMAL LOW (ref 6.5–8.1)

## 2020-03-13 LAB — ECHOCARDIOGRAM COMPLETE
Area-P 1/2: 3.12 cm2
S' Lateral: 2.4 cm

## 2020-03-13 LAB — CBC
HCT: 38.9 % — ABNORMAL LOW (ref 39.0–52.0)
Hemoglobin: 13.4 g/dL (ref 13.0–17.0)
MCH: 31.8 pg (ref 26.0–34.0)
MCHC: 34.4 g/dL (ref 30.0–36.0)
MCV: 92.2 fL (ref 80.0–100.0)
Platelets: 200 10*3/uL (ref 150–400)
RBC: 4.22 MIL/uL (ref 4.22–5.81)
RDW: 13.8 % (ref 11.5–15.5)
WBC: 6.4 10*3/uL (ref 4.0–10.5)
nRBC: 0 % (ref 0.0–0.2)

## 2020-03-13 LAB — RESP PANEL BY RT-PCR (FLU A&B, COVID) ARPGX2
Influenza A by PCR: NEGATIVE
Influenza B by PCR: NEGATIVE
SARS Coronavirus 2 by RT PCR: NEGATIVE

## 2020-03-13 LAB — AMMONIA: Ammonia: 39 umol/L — ABNORMAL HIGH (ref 9–35)

## 2020-03-13 MED ORDER — SULFAMETHOXAZOLE-TRIMETHOPRIM 800-160 MG PO TABS: 1.0000 | ORAL_TABLET | Freq: Two times a day (BID) | ORAL | 0 refills | Status: AC

## 2020-03-13 MED ORDER — SODIUM CHLORIDE 0.9% FLUSH
3.0000 mL | Freq: Two times a day (BID) | INTRAVENOUS | Status: DC
  Administered 2020-03-13 (×2): 3 mL via INTRAVENOUS

## 2020-03-13 MED ORDER — ASPIRIN 325 MG PO TBEC: 325.0000 mg | DELAYED_RELEASE_TABLET | Freq: Every day | ORAL | 0 refills | Status: DC

## 2020-03-13 MED ORDER — GADOBUTROL 1 MMOL/ML IV SOLN
7.5000 mL | Freq: Once | INTRAVENOUS | Status: AC | PRN
  Administered 2020-03-13: 7.5 mL via INTRAVENOUS

## 2020-03-13 MED ORDER — VALPROIC ACID 250 MG PO CAPS
250.0000 mg | ORAL_CAPSULE | Freq: Three times a day (TID) | ORAL | Status: DC
  Administered 2020-03-13 (×2): 250 mg via ORAL
  Filled 2020-03-13 (×3): qty 1

## 2020-03-13 MED ORDER — ENOXAPARIN SODIUM 40 MG/0.4ML ~~LOC~~ SOLN
40.0000 mg | SUBCUTANEOUS | Status: DC
  Administered 2020-03-13: 10:00:00 40 mg via SUBCUTANEOUS
  Filled 2020-03-13: qty 0.4

## 2020-03-13 MED ORDER — ACETAMINOPHEN 650 MG RE SUPP: 650.0000 mg | Freq: Four times a day (QID) | RECTAL | Status: DC | PRN

## 2020-03-13 MED ORDER — ACETAMINOPHEN 325 MG PO TABS: 650.0000 mg | ORAL_TABLET | Freq: Four times a day (QID) | ORAL | Status: DC | PRN

## 2020-03-13 MED ORDER — IOHEXOL 350 MG/ML SOLN
75.0000 mL | Freq: Once | INTRAVENOUS | Status: AC | PRN
  Administered 2020-03-13: 75 mL via INTRAVENOUS

## 2020-03-13 MED ORDER — SODIUM CHLORIDE 0.9 % IV SOLN: INTRAVENOUS | Status: AC

## 2020-03-13 MED ORDER — ENZALUTAMIDE 40 MG PO TABS: 160.0000 mg | ORAL_TABLET | Freq: Every day | ORAL | Status: DC

## 2020-03-13 MED ORDER — SULFAMETHOXAZOLE-TRIMETHOPRIM 800-160 MG PO TABS
1.0000 | ORAL_TABLET | Freq: Two times a day (BID) | ORAL | Status: DC
  Administered 2020-03-13 (×2): 1 via ORAL
  Filled 2020-03-13 (×2): qty 1

## 2020-03-13 MED ORDER — VALPROIC ACID 250 MG PO CAPS: 250.0000 mg | ORAL_CAPSULE | Freq: Three times a day (TID) | ORAL | 1 refills | Status: DC

## 2020-03-13 MED ORDER — ASPIRIN EC 325 MG PO TBEC
325.0000 mg | DELAYED_RELEASE_TABLET | Freq: Every day | ORAL | Status: DC
  Administered 2020-03-13: 13:00:00 325 mg via ORAL
  Filled 2020-03-13: qty 1

## 2020-03-13 NOTE — ED Notes (Signed)
Pt has been ambulating by himself to the bathroom.

## 2020-03-13 NOTE — Procedures (Addendum)
Patient Name: Colin Lewis  MRN: [id number]  Epilepsy Attending: Charlsie Quest  Referring Physician/Provider: Dr. Beola Cord Date: 03/13/2020 Duration: 25.54 minutes  Patient history: 52 old male who presented with aphasia expressive more than sensory.  EEG developed seizures.  Level of alertness: Awake  AEDs during EEG study: LEV  Technical aspects: This EEG study was done with scalp electrodes positioned according to the 10-20 International system of electrode placement. Electrical activity was acquired at a sampling rate of 500Hz  and reviewed with a high frequency filter of 70Hz  and a low frequency filter of 1Hz . EEG data were recorded continuously and digitally stored.   Description: The posterior dominant rhythm consists of 9 Hz activity of moderate voltage (25-35 uV) seen predominantly in posterior head regions, symmetric and reactive to eye opening and eye closing.  EEG showed intermittent 2 to 3 Hz delta slowing was also noted in left temporal region, at times rhythmic. Physiology photic driving was seen during photic stimulation.  Hyperventilation was not performed.     ABNORMALITY -Intermittent slow, left temporal region  IMPRESSION: This study is suggestive of nonspecific cortical dysfunction in left temporal region. No seizures or epileptiform discharges were seen throughout the recording.  Marlicia Sroka 

## 2020-03-13 NOTE — Discharge Summary (Addendum)
Discharge Summary  Colin Lewis [id number] DOB: [birth year]  PCP: Jilda Panda, MD  Admit date: 03/12/2020 Discharge date: 03/13/2020  Time spent: 45 minutes  Recommendations for Outpatient Follow-up:  1. New medication: Bactrim DS p.o. x5 days 2. New medication: Depakote 250 p.o. 3 times daily 3. Patient will follow up with his PCP in 1 week 4. Patient will follow up with neurology in 1 month  Discharge Diagnoses:  Active Hospital Problems   Diagnosis Date Noted  . Aphasia 03/13/2020  . Prostate cancer ([place]) 03/13/2020  . Cellulitis of left ear 03/13/2020  . HTN (hypertension) 03/13/2020  . HLD (hyperlipidemia) 03/13/2020  . Prolonged QT interval 03/13/2020  . AKI (acute kidney injury) ([place]) 03/13/2020    Resolved Hospital Problems  No resolved problems to display.    Discharge Condition: Improved, being discharged home  Diet recommendation: Heart healthy  Vitals:   03/13/20 1232 03/13/20 1500  BP: (!) 158/87 (!) 141/71  Pulse: 78 72  Resp: 14 17  Temp: 97.6 F (36.4 C)   SpO2: 98% 98%    History of present illness:  86 year old male past medical history of hypertension, prostate CA and renal stents who presented to the emergency room on the evening of 12/26 with inability to talk.  Apparently, the patient did not really go to bed the night before and perhaps symptoms have been lasting as long as 20 hours.  Patient son noted that the patient was not able to talk definitively as of early this morning.  On an unrelated note, patient had a biopsy done of his left ear over a week ago and since then it has become quite red causing discomfort.  In the emergency room, patient was evaluated and found to have a left ear cellulitis.  Neurology was consulted to rule out stroke.  CT and MRI both noted to be negative.  Neurology ordered EEG and CT angio of head and neck as well as echocardiogram.  Patient brought into the hospitalist service.  Hospital Course:  Principal  Problem:   Aphasia: Symptoms fully resolved by later in the morning, 12/27.  Patient alert, appropriate, interactive.  Eating without any incident.  CT angiogram of head and neck noted no evidence of acute CVA or any evidence of obstruction.  Echocardiogram only noted diastolic dysfunction.  EEG done did note some intermittent slowing in the left temporal region suggestive of nonspecific cortical dysfunction in that area.  Note active seizures or epileptiform discharges were seen throughout the recording.  After sameness, patient was started on Depakote 250 p.o. 3 times daily.  Although not listed on his med profile, patient does take an aspirin 325 daily and neurology recommended continuing that.  The differential diagnosis was felt to be epileptic aphasia versus prolonged focal cerebral hypoperfusion without stroke.  Patient will be seen by neurology follow-up in 1 month. Active Problems:   Prostate cancer [facility]): Continue Xtandi    Cellulitis of left ear: Started on Augmentin.  No signs of sepsis, that has been ruled out.   HTN (hypertension): Continue home medications  Chronic diastolic heart failure: Incidentally noted on echocardiogram.  Euvolemic.    HLD (hyperlipidemia)    Prolonged QTc interval: QTC of 518.  Noted on EKG.  Avoid prolonged medications.  Chronic kidney disease stage III: Patient came in with a creatinine of 1.53 and a GFR of 44.  He was given IV fluids.  Creatinine this morning mildly improved at 1.47.  Continued on fluids.  Looking at his previous  labs, he had earlier renal function over 10 years ago.   Procedures:  Echocardiogram done [id number]: Chronic diastolic dysfunction.  No evidence of thrombus.  No valvular dysfunction  Consultations:  Neurology  Discharge Exam: BP (!) 141/71   Pulse 72   Temp 97.6 F (36.4 C) (Oral)   Resp 17   SpO2 98%   General: Alert and oriented x3, no acute distress Cardiovascular: Regular rate and rhythm, S1-S2 Respiratory: Clear  to auscultation bilateral  Discharge Instructions You were cared for by a hospitalist during your hospital stay. If you have any questions about your discharge medications or the care you received while you were in the hospital after you are discharged, you can call the unit and asked to speak with the hospitalist on call if the hospitalist that took care of you is not available. Once you are discharged, your primary care physician will handle any further medical issues. Please note that NO REFILLS for any discharge medications will be authorized once you are discharged, as it is imperative that you return to your primary care physician (or establish a relationship with a primary care physician if you do not have one) for your aftercare needs so that they can reassess your need for medications and monitor your lab values.   Allergies as of 03/13/2020   No Known Allergies     Medication List    TAKE these medications   albuterol 108 (90 Base) MCG/ACT inhaler Commonly known as: VENTOLIN HFA Inhale into the lungs every 6 (six) hours as needed for wheezing or shortness of breath.   aspirin 325 MG EC tablet Take 1 tablet (325 mg total) by mouth daily. Start taking on: March 14, 2020   CALCIUM 600 PO Take 600 mg by mouth daily.   cholecalciferol 25 MCG (1000 UNIT) tablet Commonly known as: VITAMIN D3 Take 1,000 Units by mouth daily.   enzalutamide 40 MG tablet Commonly known as: XTANDI Take 160 mg by mouth daily.   loratadine 10 MG tablet Commonly known as: CLARITIN Take 10 mg by mouth daily as needed for allergies.   Magnesium 400 MG Tabs Take 400 mg by mouth daily.   mometasone-formoterol 100-5 MCG/ACT Aero Commonly known as: DULERA Inhale 2 puffs into the lungs 2 (two) times daily.   simvastatin 20 MG tablet Commonly known as: ZOCOR Take 20 mg by mouth daily.   sulfamethoxazole-trimethoprim 800-160 MG tablet Commonly known as: BACTRIM DS Take 1 tablet by mouth every  12 (twelve) hours for 9 doses.   telmisartan 40 MG tablet Commonly known as: MICARDIS Take 40 mg by mouth daily.   valproic acid 250 MG capsule Commonly known as: DEPAKENE Take 1 capsule (250 mg total) by mouth 3 (three) times daily.   venlafaxine 75 MG tablet Commonly known as: EFFEXOR Take 75 mg by mouth at bedtime. For hot flashes      No Known Allergies  Follow-up Information    Jilda Panda, MD Follow up in 1 week(s).   Specialty: Internal Medicine Contact information: [address] [place] [address] [phone]        Penni Bombard, MD. Schedule an appointment as soon as possible for a visit in 1 month(s).   Specialties: Neurology, Radiology Contact information: [address] [place] [address] [phone]                The results of significant diagnostics from this hospitalization (including imaging, microbiology, ancillary and laboratory) are listed below for reference.  Significant Diagnostic Studies: EEG  Result Date: 03/13/2020 Lora Havens, MD     03/13/2020 10:15 AM Patient Name: JHONATHAN HOXIT MRN: [id number] Epilepsy Attending: Lora Havens Referring Physician/Provider: Dr. Neva Seat Date: 03/13/2020 Duration: 25.54 minutes Patient history: 77 old male who presented with aphasia expressive more than sensory.  EEG developed seizures. Level of alertness: Awake AEDs during EEG study: LEV Technical aspects: This EEG study was done with scalp electrodes positioned according to the 10-20 International system of electrode placement. Electrical activity was acquired at a sampling rate of 500Hz  and reviewed with a high frequency filter of 70Hz  and a low frequency filter of 1Hz . EEG data were recorded continuously and digitally stored. Description: The posterior dominant rhythm consists of 9 Hz activity of moderate voltage (25-35 uV) seen predominantly in posterior head regions, symmetric and reactive to eye opening  and eye closing.  EEG showed intermittent 2 to 3 Hz delta slowing was also noted in left temporal region, at times rhythmic. Physiology photic driving was seen during photic stimulation.  Hyperventilation was not performed.   ABNORMALITY -Intermittent slow, left temporal region IMPRESSION: This study is suggestive of nonspecific cortical dysfunction in left temporal region. No seizures or epileptiform discharges were seen throughout the recording. Priyanka Barbra Sarks   CT ANGIO HEAD W OR WO CONTRAST  Result Date: 03/13/2020 CLINICAL DATA:  Neuro deficit, acute, stroke suspected EXAM: CT ANGIOGRAPHY HEAD AND NECK TECHNIQUE: Multidetector CT imaging of the head and neck was performed using the standard protocol during bolus administration of intravenous contrast. Multiplanar CT image reconstructions and MIPs were obtained to evaluate the vascular anatomy. Carotid stenosis measurements (when applicable) are obtained utilizing NASCET criteria, using the distal internal carotid diameter as the denominator. CONTRAST:  75mL OMNIPAQUE IOHEXOL 350 MG/ML SOLN COMPARISON:  03/13/2020 MRI, 03/12/2020 CT. FINDINGS: CT HEAD FINDINGS Brain: No acute infarct or intracranial hemorrhage. No mass lesion. No midline shift, ventriculomegaly or extra-axial fluid collection. Mild chronic microvascular ischemic changes. Vascular: No hyperdense vessel or unexpected calcification. Skull: Negative for fracture or focal lesion. Sinuses/Orbits: Normal orbits. Clear paranasal sinuses. No mastoid effusion. Other: None. Review of the MIP images confirms the above findings CTA NECK FINDINGS Aortic arch: Standard branching. Calcified and noncalcified aortic arch atheromatous plaque. Imaged portion shows no evidence of aneurysm or dissection. No significant stenosis of the major arch vessel origins. Right carotid system: No evidence of dissection, stenosis (50% or greater) or occlusion. Left carotid system: No evidence of dissection, stenosis (50%  or greater) or occlusion. Vertebral arteries: Dominant left vertebral artery. No evidence of dissection, stenosis (50% or greater) or occlusion. Skeleton: No acute or suspicious osseous abnormalities. Multilevel spondylosis. Other neck: No adenopathy.  No soft tissue mass. Upper chest: No acute finding. Review of the MIP images confirms the above findings CTA HEAD FINDINGS Anterior circulation: No significant stenosis, proximal occlusion, aneurysm, or vascular malformation. Posterior circulation: No significant stenosis, proximal occlusion, aneurysm, or vascular malformation. Venous sinuses: As permitted by contrast timing, patent. Anatomic variants: Fetal origin left PCA. Review of the MIP images confirms the above findings. IMPRESSION: No large vessel occlusion, high-grade narrowing, dissection or aneurysm. No acute intracranial process. Mild chronic microvascular ischemic changes. Electronically Signed   By: Primitivo Gauze M.D.   On: 03/13/2020 11:48   CT HEAD WO CONTRAST  Result Date: 03/12/2020 CLINICAL DATA:  Altered mental status EXAM: CT HEAD WITHOUT CONTRAST TECHNIQUE: Contiguous axial images were obtained from the base of the skull through the vertex without intravenous  contrast. COMPARISON:  None. FINDINGS: Brain: No evidence of acute infarction, hemorrhage, hydrocephalus, extra-axial collection or mass lesion/mass effect. Scattered low-density changes within the periventricular and subcortical white matter compatible with chronic microvascular ischemic change. Mild diffuse cerebral volume loss. Vascular: Atherosclerotic calcifications involving the large vessels of the skull base. No unexpected hyperdense vessel. Skull: Normal. Negative for fracture or focal lesion. Sinuses/Orbits: No acute finding. Other: None. IMPRESSION: 1. No acute intracranial findings. 2. Mild chronic microvascular ischemic change and cerebral volume loss. Electronically Signed   By: Davina Poke D.O.   On: 03/12/2020  15:13   CT ANGIO NECK W OR WO CONTRAST  Result Date: 03/13/2020 CLINICAL DATA:  Neuro deficit, acute, stroke suspected EXAM: CT ANGIOGRAPHY HEAD AND NECK TECHNIQUE: Multidetector CT imaging of the head and neck was performed using the standard protocol during bolus administration of intravenous contrast. Multiplanar CT image reconstructions and MIPs were obtained to evaluate the vascular anatomy. Carotid stenosis measurements (when applicable) are obtained utilizing NASCET criteria, using the distal internal carotid diameter as the denominator. CONTRAST:  28mL OMNIPAQUE IOHEXOL 350 MG/ML SOLN COMPARISON:  03/13/2020 MRI, 03/12/2020 CT. FINDINGS: CT HEAD FINDINGS Brain: No acute infarct or intracranial hemorrhage. No mass lesion. No midline shift, ventriculomegaly or extra-axial fluid collection. Mild chronic microvascular ischemic changes. Vascular: No hyperdense vessel or unexpected calcification. Skull: Negative for fracture or focal lesion. Sinuses/Orbits: Normal orbits. Clear paranasal sinuses. No mastoid effusion. Other: None. Review of the MIP images confirms the above findings CTA NECK FINDINGS Aortic arch: Standard branching. Calcified and noncalcified aortic arch atheromatous plaque. Imaged portion shows no evidence of aneurysm or dissection. No significant stenosis of the major arch vessel origins. Right carotid system: No evidence of dissection, stenosis (50% or greater) or occlusion. Left carotid system: No evidence of dissection, stenosis (50% or greater) or occlusion. Vertebral arteries: Dominant left vertebral artery. No evidence of dissection, stenosis (50% or greater) or occlusion. Skeleton: No acute or suspicious osseous abnormalities. Multilevel spondylosis. Other neck: No adenopathy.  No soft tissue mass. Upper chest: No acute finding. Review of the MIP images confirms the above findings CTA HEAD FINDINGS Anterior circulation: No significant stenosis, proximal occlusion, aneurysm, or  vascular malformation. Posterior circulation: No significant stenosis, proximal occlusion, aneurysm, or vascular malformation. Venous sinuses: As permitted by contrast timing, patent. Anatomic variants: Fetal origin left PCA. Review of the MIP images confirms the above findings. IMPRESSION: No large vessel occlusion, high-grade narrowing, dissection or aneurysm. No acute intracranial process. Mild chronic microvascular ischemic changes. Electronically Signed   By: Primitivo Gauze M.D.   On: 03/13/2020 11:48   MR Brain Wo Contrast (neuro protocol)  Result Date: 03/12/2020 CLINICAL DATA:  Mental status change. EXAM: MRI HEAD WITHOUT CONTRAST TECHNIQUE: Multiplanar, multiecho pulse sequences of the brain and surrounding structures were obtained without intravenous contrast. COMPARISON:  Head CT 03/12/2020 FINDINGS: The study is mildly motion degraded. Brain: There is no evidence of an acute infarct, intracranial hemorrhage, mass, midline shift, or extra-axial fluid collection. Scattered small foci of T2 hyperintensity are present in the cerebral white matter bilaterally, nonspecific though most often seen with chronic small vessel ischemia. A subcentimeter chronic right cerebellar infarct is noted. There is mild generalized cerebral atrophy. Vascular: Major intracranial vascular flow voids are preserved. Skull and upper cervical spine: Unremarkable bone marrow signal. Sinuses/Orbits: Bilateral cataract extraction. Minimal bilateral ethmoid air cell mucosal thickening. Clear mastoid air cells. Other: None. IMPRESSION: 1. No acute intracranial abnormality. 2. Mild chronic small vessel ischemic disease and cerebral atrophy.  3. Small chronic right cerebellar infarct. Electronically Signed   By: Logan Bores M.D.   On: 03/12/2020 19:31   MR BRAIN W CONTRAST  Result Date: 03/13/2020 CLINICAL DATA:  Personal history of melanoma. Aphasia. Concerns for metastatic disease or leptomeningeal disease. EXAM: MRI HEAD  WITH CONTRAST TECHNIQUE: Multiplanar, multiecho pulse sequences of the brain and surrounding structures were obtained with intravenous contrast. CONTRAST:  7.74mL GADAVIST GADOBUTROL 1 MMOL/ML IV SOLN COMPARISON:  MR head without contrast 03/12/2020 FINDINGS: Brain: Postcontrast images demonstrate no pathologic enhancement. No evidence for metastatic or left a meningeal disease. Vascular: Normal vascular enhancement is present. Skull and upper cervical spine: The craniocervical junction is normal. Upper cervical spine is within normal limits. Marrow signal is unremarkable. Sinuses/Orbits: The paranasal sinuses and mastoid air cells are clear. The globes and orbits are within normal limits. IMPRESSION: No evidence for metastatic or meningeal disease to the brain. No pathologic enhancement. Electronically Signed   By: San Morelle M.D.   On: 03/13/2020 03:36    Microbiology: Recent Results (from the past 240 hour(s))  Resp Panel by RT-PCR (Flu A&B, Covid) Nasopharyngeal Swab     Status: None   Collection Time: 03/12/20 10:56 PM   Specimen: Nasopharyngeal Swab; Nasopharyngeal(NP) swabs in vial transport medium  Result Value Ref Range Status   SARS Coronavirus 2 by RT PCR NEGATIVE NEGATIVE Final    Comment: (NOTE) SARS-CoV-2 target nucleic acids are NOT DETECTED.  The SARS-CoV-2 RNA is generally detectable in upper respiratory specimens during the acute phase of infection. The lowest concentration of SARS-CoV-2 viral copies this assay can detect is 138 copies/mL. A negative result does not preclude SARS-Cov-2 infection and should not be used as the sole basis for treatment or other patient management decisions. A negative result may occur with  improper specimen collection/handling, submission of specimen other than nasopharyngeal swab, presence of viral mutation(s) within the areas targeted by this assay, and inadequate number of viral copies(<138 copies/mL). A negative result must be  combined with clinical observations, patient history, and epidemiological information. The expected result is Negative.  Fact Sheet for Patients:  EntrepreneurPulse.com.au  Fact Sheet for Healthcare Providers:  IncredibleEmployment.be  This test is no t yet approved or cleared by the [place] FDA and  has been authorized for detection and/or diagnosis of SARS-CoV-2 by FDA under an Emergency Use Authorization (EUA). This EUA will remain  in effect (meaning this test can be used) for the duration of the COVID-19 declaration under Section 564(b)(1) of the Act, 21 U.S.C.section 360bbb-3(b)(1), unless the authorization is terminated  or revoked sooner.       Influenza A by PCR NEGATIVE NEGATIVE Final   Influenza B by PCR NEGATIVE NEGATIVE Final    Comment: (NOTE) The Xpert Xpress SARS-CoV-2/FLU/RSV plus assay is intended as an aid in the diagnosis of influenza from Nasopharyngeal swab specimens and should not be used as a sole basis for treatment. Nasal washings and aspirates are unacceptable for Xpert Xpress SARS-CoV-2/FLU/RSV testing.  Fact Sheet for Patients: EntrepreneurPulse.com.au  Fact Sheet for Healthcare Providers: IncredibleEmployment.be  This test is not yet approved or cleared by the [place] FDA and has been authorized for detection and/or diagnosis of SARS-CoV-2 by FDA under an Emergency Use Authorization (EUA). This EUA will remain in effect (meaning this test can be used) for the duration of the COVID-19 declaration under Section 564(b)(1) of the Act, 21 U.S.C. section 360bbb-3(b)(1), unless the authorization is terminated or revoked.  Performed at [facility]  Lab, [address], [place], [place] [address]      Labs: Basic Metabolic Panel: Recent Labs  Lab 03/12/20 1439 03/13/20 0418  NA 137 139  K 3.8 3.5  CL 101 103  CO2 25 27  GLUCOSE 124* 106*  BUN 28* 24*   CREATININE 1.53* 1.47*  CALCIUM 10.6* 10.3   Liver Function Tests: Recent Labs  Lab 03/12/20 1439 03/13/20 0418  AST 27 34  ALT 17 15  ALKPHOS 99 91  BILITOT 0.7 0.7  PROT 7.0 6.3*  ALBUMIN 3.8 3.4*   No results for input(s): LIPASE, AMYLASE in the last 168 hours. Recent Labs  Lab 03/13/20 1045  AMMONIA 39*   CBC: Recent Labs  Lab 03/12/20 1439 03/13/20 0418  WBC 5.1 6.4  NEUTROABS 3.0  --   HGB 13.6 13.4  HCT 41.8 38.9*  MCV 93.9 92.2  PLT 216 200   Cardiac Enzymes: No results for input(s): CKTOTAL, CKMB, CKMBINDEX, TROPONINI in the last 168 hours. BNP: BNP (last 3 results) No results for input(s): BNP in the last 8760 hours.  ProBNP (last 3 results) No results for input(s): PROBNP in the last 8760 hours.  CBG: No results for input(s): GLUCAP in the last 168 hours.     Signed:  Hollice Espy, MD [facility] 03/13/2020, 4:54 PM

## 2020-03-13 NOTE — ED Notes (Signed)
First contact. Change of shift. Pt resting in bed. Remains AMS, Aphasic. Son at bedside. Will continue to monitor.

## 2020-03-13 NOTE — Progress Notes (Signed)
EEG complete - results pending 

## 2020-03-13 NOTE — Progress Notes (Signed)
  Echocardiogram 2D Echocardiogram has been performed.  Johny Chess 03/13/2020, 2:30 PM

## 2020-03-13 NOTE — Progress Notes (Signed)
Subjective: The patient's aphasia has significantly improved subjectively. His son also confirms this.  EEG is being performed at the time of follow up examination.   Objective: Current vital signs: BP 128/75   Pulse 79   Temp 97.8 F (36.6 C) (Oral)   Resp 12   SpO2 96%  Vital signs in last 24 hours: Temp:  [97.8 F (36.6 C)] 97.8 F (36.6 C) (12/26 1700) Pulse Rate:  [77-99] 79 (12/27 0900) Resp:  [11-23] 12 (12/27 0900) BP: (114-184)/(70-121) 128/75 (12/27 0900) SpO2:  [90 %-100 %] 96 % (12/27 0900)  Intake/Output from previous day: 12/26 0701 - 12/27 0700 In: -  Out: 400 [Urine:400] Intake/Output this shift: No intake/output data recorded. Nutritional status:  Diet Order            Diet NPO time specified  Diet effective now                HEENT: Left ear swelling noted Lungs: Respirations unlabored Ext: No edema  Neurologic Exam: Mental Status: Alert, oriented, thought content appropriate.  Comprehension intact. Speech with subtle dysfluency manifesting as hesitant word-finding at times as well as subtle occasional phonemic paraphasias. Made an error on each of two attempts at repeating a phrase. No dysarthria. Cranial Nerves: Visual fields full. EOMI. No nystagmus.  Smile is symmetric Tongue protrudes midline Motor: RUE and RLE 5/5 LUE and LLE 5/5 Sensory: FT intact to ankles bilaterally  Cerebellar: No ataxia with FNF bilaterally   Lab Results: Results for orders placed or performed during the hospital encounter of 03/12/20 (from the past 48 hour(s))  Protime-INR     Status: None   Collection Time: 03/12/20  2:39 PM  Result Value Ref Range   Prothrombin Time 13.5 11.4 - 15.2 seconds   INR 1.1 0.8 - 1.2    Comment: (NOTE) INR goal varies based on device and disease states. Performed at [facility], [place] [address] [place], [place] [address]   APTT     Status: None   Collection Time: 03/12/20  2:39 PM  Result Value Ref Range   aPTT 31 24  - 36 seconds    Comment: Performed at [facility] [address] [place] [address]  CBC     Status: None   Collection Time: 03/12/20  2:39 PM  Result Value Ref Range   WBC 5.1 4.0 - 10.5 K/uL   RBC 4.45 4.22 - 5.81 MIL/uL   Hemoglobin 13.6 13.0 - 17.0 g/dL   HCT 41.8 39.0 - 52.0 %   MCV 93.9 80.0 - 100.0 fL   MCH 30.6 26.0 - 34.0 pg   MCHC 32.5 30.0 - 36.0 g/dL   RDW 13.8 11.5 - 15.5 %   Platelets 216 150 - 400 K/uL   nRBC 0.0 0.0 - 0.2 %    Comment: Performed at [facility], [place] [address], [place], [place] [address]  Differential     Status: None   Collection Time: 03/12/20  2:39 PM  Result Value Ref Range   Neutrophils Relative % 59 %   Neutro Abs 3.0 1.7 - 7.7 K/uL   Lymphocytes Relative 24 %   Lymphs Abs 1.2 0.7 - 4.0 K/uL   Monocytes Relative 12 %   Monocytes Absolute 0.6 0.1 - 1.0 K/uL   Eosinophils Relative 4 %   Eosinophils Absolute 0.2 0.0 - 0.5 K/uL   Basophils Relative 1 %   Basophils Absolute 0.1 0.0 - 0.1  K/uL   Immature Granulocytes 0 %   Abs Immature Granulocytes 0.01 0.00 - 0.07 K/uL    Comment: Performed at [facility], [place] [address] [place], [place] [address]  Comprehensive metabolic panel     Status: Abnormal   Collection Time: 03/12/20  2:39 PM  Result Value Ref Range   Sodium 137 135 - 145 mmol/L   Potassium 3.8 3.5 - 5.1 mmol/L   Chloride 101 98 - 111 mmol/L   CO2 25 22 - 32 mmol/L   Glucose, Bld 124 (H) 70 - 99 mg/dL    Comment: Glucose reference range applies only to samples taken after fasting for at least 8 hours.   BUN 28 (H) 8 - 23 mg/dL   Creatinine, Ser 1.53 (H) 0.61 - 1.24 mg/dL   Calcium 10.6 (H) 8.9 - 10.3 mg/dL   Total Protein 7.0 6.5 - 8.1 g/dL   Albumin 3.8 3.5 - 5.0 g/dL   AST 27 15 - 41 U/L   ALT 17 0 - 44 U/L   Alkaline Phosphatase 99 38 - 126 U/L   Total Bilirubin 0.7 0.3 - 1.2 mg/dL   GFR, Estimated 44 (L) >60 mL/min    Comment: (NOTE) Calculated using the CKD-EPI Creatinine Equation  (2021)    Anion gap 11 5 - 15    Comment: Performed at [facility] [address], [place], [place] [address]  Resp Panel by RT-PCR (Flu A&B, Covid) Nasopharyngeal Swab     Status: None   Collection Time: 03/12/20 10:56 PM   Specimen: Nasopharyngeal Swab; Nasopharyngeal(NP) swabs in vial transport medium  Result Value Ref Range   SARS Coronavirus 2 by RT PCR NEGATIVE NEGATIVE    Comment: (NOTE) SARS-CoV-2 target nucleic acids are NOT DETECTED.  The SARS-CoV-2 RNA is generally detectable in upper respiratory specimens during the acute phase of infection. The lowest concentration of SARS-CoV-2 viral copies this assay can detect is 138 copies/mL. A negative result does not preclude SARS-Cov-2 infection and should not be used as the sole basis for treatment or other patient management decisions. A negative result may occur with  improper specimen collection/handling, submission of specimen other than nasopharyngeal swab, presence of viral mutation(s) within the areas targeted by this assay, and inadequate number of viral copies(<138 copies/mL). A negative result must be combined with clinical observations, patient history, and epidemiological information. The expected result is Negative.  Fact Sheet for Patients:  EntrepreneurPulse.com.au  Fact Sheet for Healthcare Providers:  IncredibleEmployment.be  This test is no t yet approved or cleared by the [place] FDA and  has been authorized for detection and/or diagnosis of SARS-CoV-2 by FDA under an Emergency Use Authorization (EUA). This EUA will remain  in effect (meaning this test can be used) for the duration of the COVID-19 declaration under Section 564(b)(1) of the Act, 21 U.S.C.section 360bbb-3(b)(1), unless the authorization is terminated  or revoked sooner.       Influenza A by PCR NEGATIVE NEGATIVE   Influenza B by PCR NEGATIVE NEGATIVE    Comment: (NOTE) The Xpert Xpress  SARS-CoV-2/FLU/RSV plus assay is intended as an aid in the diagnosis of influenza from Nasopharyngeal swab specimens and should not be used as a sole basis for treatment. Nasal washings and aspirates are unacceptable for Xpert Xpress SARS-CoV-2/FLU/RSV testing.  Fact Sheet for Patients: EntrepreneurPulse.com.au  Fact Sheet for Healthcare Providers: IncredibleEmployment.be  This test is not yet approved or cleared by the [place] and has been authorized  for detection and/or diagnosis of SARS-CoV-2 by FDA under an Emergency Use Authorization (EUA). This EUA will remain in effect (meaning this test can be used) for the duration of the COVID-19 declaration under Section 564(b)(1) of the Act, 21 U.S.C. section 360bbb-3(b)(1), unless the authorization is terminated or revoked.  Performed at [facility], [place] [address], [place], [place] [address]   Comprehensive metabolic panel     Status: Abnormal   Collection Time: 03/13/20  4:18 AM  Result Value Ref Range   Sodium 139 135 - 145 mmol/L   Potassium 3.5 3.5 - 5.1 mmol/L   Chloride 103 98 - 111 mmol/L   CO2 27 22 - 32 mmol/L   Glucose, Bld 106 (H) 70 - 99 mg/dL    Comment: Glucose reference range applies only to samples taken after fasting for at least 8 hours.   BUN 24 (H) 8 - 23 mg/dL   Creatinine, Ser 1.47 (H) 0.61 - 1.24 mg/dL   Calcium 10.3 8.9 - 10.3 mg/dL   Total Protein 6.3 (L) 6.5 - 8.1 g/dL   Albumin 3.4 (L) 3.5 - 5.0 g/dL   AST 34 15 - 41 U/L   ALT 15 0 - 44 U/L   Alkaline Phosphatase 91 38 - 126 U/L   Total Bilirubin 0.7 0.3 - 1.2 mg/dL   GFR, Estimated 46 (L) >60 mL/min    Comment: (NOTE) Calculated using the CKD-EPI Creatinine Equation (2021)    Anion gap 9 5 - 15    Comment: Performed at [facility], [place] [address], [place], [place] [address]  CBC     Status: Abnormal   Collection Time: 03/13/20  4:18 AM  Result Value Ref Range   WBC 6.4 4.0 - 10.5 K/uL    RBC 4.22 4.22 - 5.81 MIL/uL   Hemoglobin 13.4 13.0 - 17.0 g/dL   HCT 38.9 (L) 39.0 - 52.0 %   MCV 92.2 80.0 - 100.0 fL   MCH 31.8 26.0 - 34.0 pg   MCHC 34.4 30.0 - 36.0 g/dL   RDW 13.8 11.5 - 15.5 %   Platelets 200 150 - 400 K/uL   nRBC 0.0 0.0 - 0.2 %    Comment: Performed at [facility], [place] [address] [place], [place] [address]    Recent Results (from the past 240 hour(s))  Resp Panel by RT-PCR (Flu A&B, Covid) Nasopharyngeal Swab     Status: None   Collection Time: 03/12/20 10:56 PM   Specimen: Nasopharyngeal Swab; Nasopharyngeal(NP) swabs in vial transport medium  Result Value Ref Range Status   SARS Coronavirus 2 by RT PCR NEGATIVE NEGATIVE Final    Comment: (NOTE) SARS-CoV-2 target nucleic acids are NOT DETECTED.  The SARS-CoV-2 RNA is generally detectable in upper respiratory specimens during the acute phase of infection. The lowest concentration of SARS-CoV-2 viral copies this assay can detect is 138 copies/mL. A negative result does not preclude SARS-Cov-2 infection and should not be used as the sole basis for treatment or other patient management decisions. A negative result may occur with  improper specimen collection/handling, submission of specimen other than nasopharyngeal swab, presence of viral mutation(s) within the areas targeted by this assay, and inadequate number of viral copies(<138 copies/mL). A negative result must be combined with clinical observations, patient history, and epidemiological information. The expected result is Negative.  Fact Sheet for Patients:  EntrepreneurPulse.com.au  Fact Sheet for Healthcare Providers:  IncredibleEmployment.be  This test is no t yet approved  or cleared by the [place] and  has been authorized for detection and/or diagnosis of SARS-CoV-2 by FDA under an Emergency Use Authorization (EUA). This EUA will remain  in effect (meaning this test can be used) for the  duration of the COVID-19 declaration under Section 564(b)(1) of the Act, 21 U.S.C.section 360bbb-3(b)(1), unless the authorization is terminated  or revoked sooner.       Influenza A by PCR NEGATIVE NEGATIVE Final   Influenza B by PCR NEGATIVE NEGATIVE Final    Comment: (NOTE) The Xpert Xpress SARS-CoV-2/FLU/RSV plus assay is intended as an aid in the diagnosis of influenza from Nasopharyngeal swab specimens and should not be used as a sole basis for treatment. Nasal washings and aspirates are unacceptable for Xpert Xpress SARS-CoV-2/FLU/RSV testing.  Fact Sheet for Patients: BloggerCourse.com  Fact Sheet for Healthcare Providers: SeriousBroker.it  This test is not yet approved or cleared by the [place] FDA and has been authorized for detection and/or diagnosis of SARS-CoV-2 by FDA under an Emergency Use Authorization (EUA). This EUA will remain in effect (meaning this test can be used) for the duration of the COVID-19 declaration under Section 564(b)(1) of the Act, 21 U.S.C. section 360bbb-3(b)(1), unless the authorization is terminated or revoked.  Performed at [facility], [address], [place], [place] [address]     Lipid Panel No results for input(s): CHOL, TRIG, HDL, CHOLHDL, VLDL, LDLCALC in the last 72 hours.  Studies/Results: CT HEAD WO CONTRAST  Result Date: 03/12/2020 CLINICAL DATA:  Altered mental status EXAM: CT HEAD WITHOUT CONTRAST TECHNIQUE: Contiguous axial images were obtained from the base of the skull through the vertex without intravenous contrast. COMPARISON:  None. FINDINGS: Brain: No evidence of acute infarction, hemorrhage, hydrocephalus, extra-axial collection or mass lesion/mass effect. Scattered low-density changes within the periventricular and subcortical white matter compatible with chronic microvascular ischemic change. Mild diffuse cerebral volume loss. Vascular: Atherosclerotic  calcifications involving the large vessels of the skull base. No unexpected hyperdense vessel. Skull: Normal. Negative for fracture or focal lesion. Sinuses/Orbits: No acute finding. Other: None. IMPRESSION: 1. No acute intracranial findings. 2. Mild chronic microvascular ischemic change and cerebral volume loss. Electronically Signed   By: Duanne Guess D.O.   On: 03/12/2020 15:13   MR Brain Wo Contrast (neuro protocol)  Result Date: 03/12/2020 CLINICAL DATA:  Mental status change. EXAM: MRI HEAD WITHOUT CONTRAST TECHNIQUE: Multiplanar, multiecho pulse sequences of the brain and surrounding structures were obtained without intravenous contrast. COMPARISON:  Head CT 03/12/2020 FINDINGS: The study is mildly motion degraded. Brain: There is no evidence of an acute infarct, intracranial hemorrhage, mass, midline shift, or extra-axial fluid collection. Scattered small foci of T2 hyperintensity are present in the cerebral white matter bilaterally, nonspecific though most often seen with chronic small vessel ischemia. A subcentimeter chronic right cerebellar infarct is noted. There is mild generalized cerebral atrophy. Vascular: Major intracranial vascular flow voids are preserved. Skull and upper cervical spine: Unremarkable bone marrow signal. Sinuses/Orbits: Bilateral cataract extraction. Minimal bilateral ethmoid air cell mucosal thickening. Clear mastoid air cells. Other: None. IMPRESSION: 1. No acute intracranial abnormality. 2. Mild chronic small vessel ischemic disease and cerebral atrophy. 3. Small chronic right cerebellar infarct. Electronically Signed   By: Sebastian Ache M.D.   On: 03/12/2020 19:31   MR BRAIN W CONTRAST  Result Date: 03/13/2020 CLINICAL DATA:  Personal history of melanoma. Aphasia. Concerns for metastatic disease or leptomeningeal disease. EXAM: MRI HEAD WITH CONTRAST TECHNIQUE: Multiplanar, multiecho pulse sequences  of the brain and surrounding structures were obtained with  intravenous contrast. CONTRAST:  7.37mL GADAVIST GADOBUTROL 1 MMOL/ML IV SOLN COMPARISON:  MR head without contrast 03/12/2020 FINDINGS: Brain: Postcontrast images demonstrate no pathologic enhancement. No evidence for metastatic or left a meningeal disease. Vascular: Normal vascular enhancement is present. Skull and upper cervical spine: The craniocervical junction is normal. Upper cervical spine is within normal limits. Marrow signal is unremarkable. Sinuses/Orbits: The paranasal sinuses and mastoid air cells are clear. The globes and orbits are within normal limits. IMPRESSION: No evidence for metastatic or meningeal disease to the brain. No pathologic enhancement. Electronically Signed   By: San Morelle M.D.   On: 03/13/2020 03:36    Medications:  Prior to Admission: (Not in a hospital admission)  Scheduled: . enoxaparin (LOVENOX) injection  40 mg Subcutaneous Q24H  . enzalutamide  160 mg Oral Daily  . sodium chloride flush  3 mL Intravenous Q12H  . sulfamethoxazole-trimethoprim  1 tablet Oral Q12H   Continuous: . sodium chloride 125 mL/hr at 03/13/20 0214     Assessment: 86 year old male presenting with expressive aphasia. DDx includes epileptic aphasia and MRI negative stroke versus prolonged focal cerebral hypoperfusion without stroke. Lower on the DDx but also possible would be initial manifestation of a dementia affecting speech, such as primary progressive aphasia.  1. Follow up exam reveals mild dysfluency manifesting as hesitant word-finding at times, as well as subtle occasional phonemic paraphasias. Made an error on each of two attempts at repeating a phrase. Subjectively, however, he is significantly improved since his initial presentation.  2. MRI brain without acute abnormality. An old right cerebellar infarction is noted. Follow-up post-contrast exam shows no abnormal enhancement.  3. Was loaded with a one-time dose of Keppra yesterday night.   Recommendations: 1. EEG  shows left temporal slowing. 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  2. Starting scheduled-dose valproic acid at 250 mg po TID. Has normal transaminases. Ordering an ammonia level.  3. Continue home ASA at 325 mg po qd. It is not listed in Epic as a home medication, but patient states that he takes it daily.  4. CTA of head and neck to rule out critical stenosis. 5. TTE 6. Cardiac telemetry 7. Will need outpatient Neurology follow up.     LOS: 0 days   @Electronically  signed: Dr. Kerney Elbe 03/13/2020  10:00 AM

## 2020-03-13 NOTE — H&P (Signed)
History and Physical   Colin Lewis [id number] DOB: April 11, 1932 DOA: 03/12/2020  PCP: Jilda Panda, MD   Patient coming from: Home  Chief Complaint: Aphasia  HPI: Colin Lewis is a 86 y.o. male with medical history significant of low back pain, hypertension, hyperlipidemia, prostate cancer, melanoma, renal stents who presents with expressive aphasia.  Due to his aphasia he is unable to significantly participate in HPI, history provided primarily by patient's son and chart review.  Patient lives with his wife who has dementia.  He evidently never went to bed last night and may have had some symptoms for about 20 hours.  He has had significant stress of aphasia since this morning per son.  Son also states that he has had complaints about his left ear and he has had some redness and inflammation in the ears since he had a biopsy performed.  Especially last couple days.  No fever.  ED Course: Vital signs stable in ED.  Lab work-up showed BMP with creatinine of 1.53 with unknown baseline, glucose 134.  LFTs showed calcium of 10.6.  CBC within normal notes.  PT, PTT, INR within normal limits.  Respiratory panel for flu and Covid pending.  Review of Systems: Patient denies fever, chest pain, shortness of breath, abdominal pain, nausea.  Past Medical History:  Diagnosis Date  . History of kidney stones   . History of urinary tract obstruction   . Hyperlipidemia   . Hypertension   . Prostate cancer ([place])   . Thoracic spondylosis   . Wears glasses     Past Surgical History:  Procedure Laterality Date  . CATARACT EXTRACTION W/ INTRAOCULAR LENS  IMPLANT, BILATERAL  2013  . CRYOABLATION N/A 09/13/2013   Procedure: CRYO ABLATION PROSTATE;  Surgeon: Ailene Rud, MD;  Location: [facility];  Service: Urology;  Laterality: N/A;  . CYSTO/  RIGHT RETROGRADE PYELOGRAM/  RIGHT URETERAL STENT PLACEMENT  01-18-2001  . CYSTO/ RIGHT RETROGRADE PYELOGRAM/ RIGHT URETEROSCOPIC LASER  LITHOTRIPSY STONE EXTRACTION / STENT PLACEMENT  10-28-2007  . DILATATION MEATAL STENOSIS/  CYSTOSCOPY/  LEFT RETROGRADE PYELOGRAM/ LEFT URETEROSOPIC LASER LITHOTRIPSY STONE EXTRACTION / STENT PLACEMENT  10-04-1999  . EXTRACORPOREAL SHOCK WAVE LITHOTRIPSY Left 05-18-2012  . LEFT URETEROSCOPIC STONE EXTRACTION  10-13-2001 &  04-10-2007    Social History  reports that he has never smoked. He has never used smokeless tobacco. He reports that he does not drink alcohol and does not use drugs.  No Known Allergies  No family history on file. Unable to fully review due to patient's aphasia.  Prior to Admission medications   Medication Sig Start Date End Date Taking? Authorizing Provider  albuterol (VENTOLIN HFA) 108 (90 Base) MCG/ACT inhaler Inhale into the lungs every 6 (six) hours as needed for wheezing or shortness of breath.   Yes [provider]  Calcium Carbonate (CALCIUM 600 PO) Take 600 mg by mouth daily.   Yes [provider]  cholecalciferol (VITAMIN D3) 25 MCG (1000 UNIT) tablet Take 1,000 Units by mouth daily.   Yes [provider]  enzalutamide (XTANDI) 40 MG tablet Take 160 mg by mouth daily.   Yes [provider]  loratadine (CLARITIN) 10 MG tablet Take 10 mg by mouth daily as needed for allergies.   Yes [provider]  Magnesium 400 MG TABS Take 400 mg by mouth daily.   Yes [provider]  mometasone-formoterol (DULERA) 100-5 MCG/ACT AERO Inhale 2 puffs into the lungs 2 (two) times daily.  Yes [provider]  simvastatin (ZOCOR) 20 MG tablet Take 20 mg by mouth daily.   Yes [provider]  telmisartan (MICARDIS) 40 MG tablet Take 40 mg by mouth daily. 09/29/19  Yes [provider]  venlafaxine (EFFEXOR) 75 MG tablet Take 75 mg by mouth at bedtime. For hot flashes 02/21/20  Yes [provider]    Physical Exam: Vitals:   03/12/20 2315 03/12/20 2330 03/12/20 2345 03/13/20 0000  BP: 135/77  (!) 153/78 137/85 (!) 149/121  Pulse: 95 90 91 91  Resp: 11 13 14  (!) 22  Temp:      TempSrc:      SpO2: 95% 97% 97% 97%   Physical Exam Constitutional:      General: He is not in acute distress.    Appearance: Normal appearance.  HENT:     Head: Normocephalic and atraumatic.     Mouth/Throat:     Mouth: Mucous membranes are moist.     Pharynx: Oropharynx is clear.  Eyes:     Extraocular Movements: Extraocular movements intact.     Pupils: Pupils are equal, round, and reactive to light.  Cardiovascular:     Rate and Rhythm: Normal rate and regular rhythm.     Pulses: Normal pulses.     Heart sounds: Normal heart sounds.  Pulmonary:     Effort: Pulmonary effort is normal. No respiratory distress.     Breath sounds: Normal breath sounds.  Abdominal:     General: Bowel sounds are normal. There is no distension.     Palpations: Abdomen is soft.     Tenderness: There is no abdominal tenderness.  Musculoskeletal:        General: No swelling or deformity.  Skin:    General: Skin is warm and dry.     Comments: Left ear with warmth, redness, purulence noted within external auditory ear canal in the area of excision.  See attached photo.  Neurological:     Comments: Expressive aphasia Cranial Nerves: II: Pupils equal, round, and reactive to light.  III,IV, VI: EOMI without ptosis or diploplia.  V: Facial sensation is symmetric tolight touch VII: Facial movement is symmetric.  VIII: hearing is intact to voice X: Uvula elevates symmetrically XI: Shoulder shrug is symmetric. XII: tongue is midline without atrophy or fasciculations.  Motor: Good effort thorughout, at Least 5/5 bilateral UE, 5/5 bilateral lower extremitiy      Labs on Admission: I have personally reviewed following labs and imaging studies  CBC: Recent Labs  Lab 03/12/20 1439  WBC 5.1  NEUTROABS 3.0  HGB 13.6  HCT 41.8  MCV 93.9  PLT 216    Basic Metabolic Panel: Recent Labs  Lab 03/12/20 1439   NA 137  K 3.8  CL 101  CO2 25  GLUCOSE 124*  BUN 28*  CREATININE 1.53*  CALCIUM 10.6*    GFR: CrCl cannot be calculated (Unknown ideal weight.).  Liver Function Tests: Recent Labs  Lab 03/12/20 1439  AST 27  ALT 17  ALKPHOS 99  BILITOT 0.7  PROT 7.0  ALBUMIN 3.8    Urine analysis:    Component Value Date/Time   COLORURINE YELLOW 04/10/2007 0630   APPEARANCEUR CLEAR 04/10/2007 0630   LABSPEC 1.019 04/10/2007 0630   PHURINE 6.5 04/10/2007 0630   GLUCOSEU NEGATIVE 04/10/2007 0630   HGBUR NEGATIVE 04/10/2007 0630   BILIRUBINUR NEGATIVE 04/10/2007 0630   KETONESUR NEGATIVE 04/10/2007 0630   PROTEINUR NEGATIVE 04/10/2007 0630   UROBILINOGEN 1.0  04/10/2007 0630   NITRITE NEGATIVE 04/10/2007 0630   LEUKOCYTESUR  04/10/2007 0630    NEGATIVE MICROSCOPIC NOT DONE ON URINES WITH NEGATIVE PROTEIN, BLOOD, LEUKOCYTES, NITRITE, OR GLUCOSE <1000 mg/dL.    Radiological Exams on Admission: CT HEAD WO CONTRAST  Result Date: 03/12/2020 CLINICAL DATA:  Altered mental status EXAM: CT HEAD WITHOUT CONTRAST TECHNIQUE: Contiguous axial images were obtained from the base of the skull through the vertex without intravenous contrast. COMPARISON:  None. FINDINGS: Brain: No evidence of acute infarction, hemorrhage, hydrocephalus, extra-axial collection or mass lesion/mass effect. Scattered low-density changes within the periventricular and subcortical white matter compatible with chronic microvascular ischemic change. Mild diffuse cerebral volume loss. Vascular: Atherosclerotic calcifications involving the large vessels of the skull base. No unexpected hyperdense vessel. Skull: Normal. Negative for fracture or focal lesion. Sinuses/Orbits: No acute finding. Other: None. IMPRESSION: 1. No acute intracranial findings. 2. Mild chronic microvascular ischemic change and cerebral volume loss. Electronically Signed   By: Davina Poke D.O.   On: 03/12/2020 15:13   MR Brain Wo Contrast (neuro  protocol)  Result Date: 03/12/2020 CLINICAL DATA:  Mental status change. EXAM: MRI HEAD WITHOUT CONTRAST TECHNIQUE: Multiplanar, multiecho pulse sequences of the brain and surrounding structures were obtained without intravenous contrast. COMPARISON:  Head CT 03/12/2020 FINDINGS: The study is mildly motion degraded. Brain: There is no evidence of an acute infarct, intracranial hemorrhage, mass, midline shift, or extra-axial fluid collection. Scattered small foci of T2 hyperintensity are present in the cerebral white matter bilaterally, nonspecific though most often seen with chronic small vessel ischemia. A subcentimeter chronic right cerebellar infarct is noted. There is mild generalized cerebral atrophy. Vascular: Major intracranial vascular flow voids are preserved. Skull and upper cervical spine: Unremarkable bone marrow signal. Sinuses/Orbits: Bilateral cataract extraction. Minimal bilateral ethmoid air cell mucosal thickening. Clear mastoid air cells. Other: None. IMPRESSION: 1. No acute intracranial abnormality. 2. Mild chronic small vessel ischemic disease and cerebral atrophy. 3. Small chronic right cerebellar infarct. Electronically Signed   By: Logan Bores M.D.   On: 03/12/2020 19:31    EKG: Independently reviewed.  Sinus rhythm, will intraventricular conduction delay likely incomplete right bundle branch block.  Prolonged QTC of 513.  Assessment/Plan Principal Problem:   Aphasia Active Problems:   Elevated serum creatinine   Prostate cancer (HCC)   Cellulitis of left ear   HTN (hypertension)   HLD (hyperlipidemia)   Prolonged QT interval  Aphasia > Work-up with aphasia unable to get significant history due to this > CT and MRI negative for stroke > Neurology following after being consulted in ED > Neurology recommends evaluating for mass versus seizures source of aphasia - Check MR brain with contrast to evaluate for mets/lesions - EEG - Received Keppra load in ED - Seizure  precautions - Swallow screen  Elevated serum creatinine > Creatinine ED 1.53 though no baseline in chart - IV fluids to see if there is any improvement. - Avoid nephrotoxic medications - Trend renal function  ?Left ear cellulitis Skin lesions status post excision > Left ear where excision was performed appears warm, red and swollen compared to the other ear.  Purulent drainage noted in the ear.  No leukocytosis.  - MR brain as above to rule out mets - Bactrim double strength twice daily once passes stroke swallow screen - Continue to monitor CBC and fever curve  Prolonged QT > QTC of 518 - Avoid QTC prolonging medications  Prostate cancer - Continue home Xtandi  Hypertension - Not currently  taking antihypertensives per son   Hyperlipidemia - Unclear if he is still taking a statin, holding off for now.    DVT prophylaxis: Lovenox  Code Status:   Full  Family Communication:  Son updated at bedside  Disposition Plan:   Patient is from:  Home  Anticipated DC to:  Pending clinical course  Anticipated DC date:  Pending clinical course  Anticipated DC barriers: None  Consults called:  Neurology, Dr.Khaladina, consulted in ED  Admission status:  Observation, telemetry   Severity of Illness: The appropriate patient status for this patient is OBSERVATION. Observation status is judged to be reasonable and necessary in order to provide the required intensity of service to ensure the patient's safety. The patient's presenting symptoms, physical exam findings, and initial radiographic and laboratory data in the context of their medical condition is felt to place them at decreased risk for further clinical deterioration. Furthermore, it is anticipated that the patient will be medically stable for discharge from the hospital within 2 midnights of admission. The following factors support the patient status of observation.   " The patient's presenting symptoms include aphasia. " The  physical exam findings include aphasia. " The initial radiographic and laboratory data are stable, creatinine elevated to 1.53 though baseline unknown.Synetta Fail MD [facility]  How to contact the [facility] Attending or Consulting provider 7A - 7P or covering provider during after hours 7P -7A, for this patient?   1. Check the care team in [facility] and look for a) attending/consulting TR[facility] provider listed and b) the [facility] team listed 2. Log into [email or web address] and use [place]'s universal password to access. If you do not have the password, please contact the hospital operator. 3. Locate the [facility] provider you are looking for under [facility] and page to a number that you can be directly reached. 4. If you still have difficulty reaching the provider, please page the [facility] (Director on Call) for the Hospitalists listed on amion for assistance.  03/13/2020, 1:05 AM

## 2020-05-03 ENCOUNTER — Other Ambulatory Visit: Payer: Self-pay

## 2020-05-03 ENCOUNTER — Ambulatory Visit (INDEPENDENT_AMBULATORY_CARE_PROVIDER_SITE_OTHER): Payer: Medicare Other | Admitting: Diagnostic Neuroimaging

## 2020-05-03 ENCOUNTER — Encounter: Payer: Self-pay | Admitting: Diagnostic Neuroimaging

## 2020-05-03 VITALS — BP 148/83 | HR 90 | Ht 64.0 in | Wt 151.0 lb

## 2020-05-03 DIAGNOSIS — G459 Transient cerebral ischemic attack, unspecified: Secondary | ICD-10-CM

## 2020-05-03 DIAGNOSIS — R4701 Aphasia: Secondary | ICD-10-CM

## 2020-05-03 DIAGNOSIS — G40109 Localization-related (focal) (partial) symptomatic epilepsy and epileptic syndromes with simple partial seizures, not intractable, without status epilepticus: Secondary | ICD-10-CM

## 2020-05-03 MED ORDER — DIVALPROEX SODIUM 250 MG PO DR TAB: 250.0000 mg | DELAYED_RELEASE_TABLET | Freq: Two times a day (BID) | ORAL | 4 refills | Status: DC
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
## 2020-05-23 ENCOUNTER — Telehealth: Payer: Self-pay | Admitting: *Deleted
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
## 2020-05-24 ENCOUNTER — Telehealth: Payer: Self-pay | Admitting: Diagnostic Neuroimaging

## 2020-05-24 NOTE — Telephone Encounter (Signed)
Done medical records faxed to [facility] office.

## 2020-05-24 NOTE — Telephone Encounter (Signed)
Pt's DMV form has been faxed

## 2020-06-13 ENCOUNTER — Other Ambulatory Visit: Payer: Self-pay

## 2020-06-13 ENCOUNTER — Ambulatory Visit
Admission: RE | Admit: 2020-06-13 | Discharge: 2020-06-13 | Disposition: A | Discharge status: Home / Self Care | Payer: Medicare Other | Source: Ambulatory Visit | Attending: Urology | Admitting: Urology

## 2020-06-13 DIAGNOSIS — M8580 Other specified disorders of bone density and structure, unspecified site: Secondary | ICD-10-CM

## 2020-06-26 ENCOUNTER — Telehealth: Payer: Self-pay | Admitting: *Deleted

## 2020-06-26 NOTE — Telephone Encounter (Signed)
Pt last office note faxed to [facility] on 06/26/20.

## 2020-07-11 ENCOUNTER — Telehealth: Payer: Self-pay | Admitting: *Deleted

## 2020-07-11 ENCOUNTER — Encounter: Payer: Self-pay | Admitting: *Deleted

## 2020-07-11 NOTE — Telephone Encounter (Signed)
Letter on MD desk for signature. 

## 2020-07-11 NOTE — Telephone Encounter (Signed)
Can send letter: "See office note." -VRP

## 2020-07-11 NOTE — Telephone Encounter (Signed)
Letter signed, sent to medical records for processing.

## 2020-07-11 NOTE — Telephone Encounter (Signed)
Received letter from [facility] for patient. He is asking for "redo of DMV letter".  Will discuss with Dr Leta Baptist. Last office note has been faxed to [facility] DMV on 06/26/20.

## 2020-08-09 ENCOUNTER — Encounter (INDEPENDENT_AMBULATORY_CARE_PROVIDER_SITE_OTHER): Payer: Medicare Other | Admitting: Ophthalmology

## 2020-09-25 ENCOUNTER — Other Ambulatory Visit: Payer: Self-pay | Admitting: Orthopedic Surgery

## 2020-10-17 ENCOUNTER — Encounter (HOSPITAL_BASED_OUTPATIENT_CLINIC_OR_DEPARTMENT_OTHER): Payer: Self-pay | Admitting: Orthopedic Surgery

## 2020-10-17 ENCOUNTER — Other Ambulatory Visit: Payer: Self-pay

## 2020-10-23 ENCOUNTER — Ambulatory Visit (HOSPITAL_BASED_OUTPATIENT_CLINIC_OR_DEPARTMENT_OTHER): Payer: Medicare Other | Admitting: Anesthesiology

## 2020-10-23 ENCOUNTER — Encounter (HOSPITAL_BASED_OUTPATIENT_CLINIC_OR_DEPARTMENT_OTHER): Payer: Self-pay | Admitting: Orthopedic Surgery

## 2020-10-23 ENCOUNTER — Ambulatory Visit (HOSPITAL_BASED_OUTPATIENT_CLINIC_OR_DEPARTMENT_OTHER)
Admission: RE | Admit: 2020-10-23 | Discharge: 2020-10-23 | Disposition: A | Discharge status: Home / Self Care | Payer: Medicare Other | Attending: Orthopedic Surgery | Admitting: Orthopedic Surgery

## 2020-10-23 ENCOUNTER — Other Ambulatory Visit: Payer: Self-pay

## 2020-10-23 ENCOUNTER — Encounter (HOSPITAL_BASED_OUTPATIENT_CLINIC_OR_DEPARTMENT_OTHER)
Admission: RE | Disposition: A | Discharge status: Home / Self Care | Payer: Self-pay | Source: Home / Self Care | Attending: Orthopedic Surgery

## 2020-10-23 DIAGNOSIS — Z85828 Personal history of other malignant neoplasm of skin: Secondary | ICD-10-CM | POA: Insufficient documentation

## 2020-10-23 DIAGNOSIS — G5602 Carpal tunnel syndrome, left upper limb: Secondary | ICD-10-CM | POA: Diagnosis present

## 2020-10-23 HISTORY — PX: CARPAL TUNNEL RELEASE: SHX101

## 2020-10-23 SURGERY — CARPAL TUNNEL RELEASE
Anesthesia: Regional | Site: Hand | Laterality: Left

## 2020-10-23 MED ORDER — ONDANSETRON HCL 4 MG/2ML IJ SOLN
INTRAMUSCULAR | Status: AC
  Filled 2020-10-23: qty 2

## 2020-10-23 MED ORDER — LACTATED RINGERS IV SOLN: INTRAVENOUS | Status: DC

## 2020-10-23 MED ORDER — BUPIVACAINE HCL (PF) 0.25 % IJ SOLN
INTRAMUSCULAR | Status: DC | PRN
  Administered 2020-10-23: 9 mL

## 2020-10-23 MED ORDER — CEFAZOLIN SODIUM-DEXTROSE 2-4 GM/100ML-% IV SOLN
INTRAVENOUS | Status: AC
  Filled 2020-10-23: qty 100

## 2020-10-23 MED ORDER — LIDOCAINE HCL (PF) 0.5 % IJ SOLN
INTRAMUSCULAR | Status: DC | PRN
  Administered 2020-10-23: 30 mL via INTRAVENOUS

## 2020-10-23 MED ORDER — 0.9 % SODIUM CHLORIDE (POUR BTL) OPTIME
TOPICAL | Status: DC | PRN
  Administered 2020-10-23: 90 mL

## 2020-10-23 MED ORDER — FENTANYL CITRATE (PF) 100 MCG/2ML IJ SOLN
INTRAMUSCULAR | Status: AC
  Filled 2020-10-23: qty 2

## 2020-10-23 MED ORDER — LIDOCAINE HCL (PF) 2 % IJ SOLN
INTRAMUSCULAR | Status: AC
  Filled 2020-10-23: qty 5

## 2020-10-23 MED ORDER — FENTANYL CITRATE (PF) 100 MCG/2ML IJ SOLN
INTRAMUSCULAR | Status: DC | PRN
  Administered 2020-10-23 (×2): 50 ug via INTRAVENOUS

## 2020-10-23 MED ORDER — ONDANSETRON HCL 4 MG/2ML IJ SOLN
INTRAMUSCULAR | Status: DC | PRN
Start: 2020-10-23 — End: 2020-10-23
  Administered 2020-10-23: 4 mg via INTRAVENOUS

## 2020-10-23 MED ORDER — OXYCODONE HCL 5 MG/5ML PO SOLN: 5.0000 mg | Freq: Once | ORAL | Status: DC | PRN

## 2020-10-23 MED ORDER — OXYCODONE HCL 5 MG PO TABS: 5.0000 mg | ORAL_TABLET | Freq: Once | ORAL | Status: DC | PRN

## 2020-10-23 MED ORDER — PROPOFOL 10 MG/ML IV BOLUS
INTRAVENOUS | Status: DC | PRN
Start: 2020-10-23 — End: 2020-10-23
  Administered 2020-10-23: 10 mg via INTRAVENOUS
  Administered 2020-10-23: 20 mg via INTRAVENOUS
  Administered 2020-10-23: 10 mg via INTRAVENOUS

## 2020-10-23 MED ORDER — HYDROMORPHONE HCL 1 MG/ML IJ SOLN: 0.2500 mg | INTRAMUSCULAR | Status: DC | PRN

## 2020-10-23 MED ORDER — CEFAZOLIN SODIUM-DEXTROSE 2-4 GM/100ML-% IV SOLN
2.0000 g | INTRAVENOUS | Status: AC
  Administered 2020-10-23: 2 g via INTRAVENOUS

## 2020-10-23 MED ORDER — HYDROCODONE-ACETAMINOPHEN 5-325 MG PO TABS: ORAL_TABLET | ORAL | 0 refills | Status: DC

## 2020-10-23 SURGICAL SUPPLY — 37 items
APL PRP STRL LF DISP 70% ISPRP (MISCELLANEOUS) ×1
BLADE SURG 15 STRL LF DISP TIS (BLADE) ×2 IMPLANT
BLADE SURG 15 STRL SS (BLADE) ×4
BNDG CMPR 9X4 STRL LF SNTH (GAUZE/BANDAGES/DRESSINGS)
BNDG ELASTIC 3X5.8 VLCR STR LF (GAUZE/BANDAGES/DRESSINGS) ×2 IMPLANT
BNDG ESMARK 4X9 LF (GAUZE/BANDAGES/DRESSINGS) IMPLANT
BNDG GAUZE ELAST 4 BULKY (GAUZE/BANDAGES/DRESSINGS) ×2 IMPLANT
CHLORAPREP W/TINT 26 (MISCELLANEOUS) ×2 IMPLANT
CORD BIPOLAR FORCEPS 12FT (ELECTRODE) ×2 IMPLANT
COVER BACK TABLE 60X90IN (DRAPES) ×2 IMPLANT
COVER MAYO STAND STRL (DRAPES) ×2 IMPLANT
CUFF TOURN SGL QUICK 18X4 (TOURNIQUET CUFF) ×2 IMPLANT
DRAPE EXTREMITY T 121X128X90 (DISPOSABLE) ×2 IMPLANT
DRAPE SURG 17X23 STRL (DRAPES) ×2 IMPLANT
DRSG PAD ABDOMINAL 8X10 ST (GAUZE/BANDAGES/DRESSINGS) ×2 IMPLANT
GAUZE SPONGE 4X4 12PLY STRL (GAUZE/BANDAGES/DRESSINGS) ×2 IMPLANT
GAUZE XEROFORM 1X8 LF (GAUZE/BANDAGES/DRESSINGS) ×2 IMPLANT
GLOVE SRG 8 PF TXTR STRL LF DI (GLOVE) ×1 IMPLANT
GLOVE SURG ENC MOIS LTX SZ6.5 (GLOVE) ×2 IMPLANT
GLOVE SURG ENC MOIS LTX SZ7.5 (GLOVE) ×2 IMPLANT
GLOVE SURG POLYISO LF SZ6.5 (GLOVE) ×2 IMPLANT
GLOVE SURG UNDER POLY LF SZ7 (GLOVE) ×4 IMPLANT
GLOVE SURG UNDER POLY LF SZ8 (GLOVE) ×2
GOWN STRL REUS W/ TWL LRG LVL3 (GOWN DISPOSABLE) ×2 IMPLANT
GOWN STRL REUS W/TWL LRG LVL3 (GOWN DISPOSABLE) ×4
GOWN STRL REUS W/TWL XL LVL3 (GOWN DISPOSABLE) ×2 IMPLANT
NEEDLE HYPO 25X1 1.5 SAFETY (NEEDLE) ×2 IMPLANT
NS IRRIG 1000ML POUR BTL (IV SOLUTION) ×2 IMPLANT
PACK BASIN DAY SURGERY FS (CUSTOM PROCEDURE TRAY) ×2 IMPLANT
PADDING CAST ABS 4INX4YD NS (CAST SUPPLIES)
PADDING CAST ABS COTTON 4X4 ST (CAST SUPPLIES) IMPLANT
STOCKINETTE 4X48 STRL (DRAPES) ×2 IMPLANT
SUT ETHILON 4 0 PS 2 18 (SUTURE) ×2 IMPLANT
SYR BULB EAR ULCER 3OZ GRN STR (SYRINGE) ×2 IMPLANT
SYR CONTROL 10ML LL (SYRINGE) ×2 IMPLANT
TOWEL GREEN STERILE FF (TOWEL DISPOSABLE) ×4 IMPLANT
UNDERPAD 30X36 HEAVY ABSORB (UNDERPADS AND DIAPERS) ×2 IMPLANT

## 2020-10-23 NOTE — Op Note (Signed)
10/23/2020 [facility]                              OPERATIVE REPORT   PREOPERATIVE DIAGNOSIS:  Left carpal tunnel syndrome.  POSTOPERATIVE DIAGNOSIS:  Left carpal tunnel syndrome.  PROCEDURE:  Left carpal tunnel release.  SURGEON:  Leanora Cover, MD  ASSISTANT:  none.  ANESTHESIA: Bier block with sedation  IV FLUIDS:  Per anesthesia flow sheet.  ESTIMATED BLOOD LOSS:  Minimal.  COMPLICATIONS:  None.  SPECIMENS:  None.  TOURNIQUET TIME:    Total Tourniquet Time Documented: Forearm (Left) - 27 minutes Total: Forearm (Left) - 27 minutes   DISPOSITION:  Stable to PACU.  LOCATION: [facility]  INDICATIONS:  87 yo male with numbness and tingling left hand.  Positive nerve conduction studies.  Nocturnal symptoms.  He wishes to have a carpal tunnel release for management of his symptoms.  Risks, benefits and alternatives of surgery were discussed including the risk of blood loss; infection; damage to nerves, vessels, tendons, ligaments, bone; failure of surgery; need for additional surgery; complications with wound healing; continued pain; recurrence of carpal tunnel syndrome; and damage to motor branch. He voiced understanding of these risks and elected to proceed.   OPERATIVE COURSE:  After being identified preoperatively by myself, the patient and I agreed upon the procedure and site of procedure.  The surgical site was marked.  Surgical consent had been signed.  He was given IV Ancef as preoperative antibiotic prophylaxis.  He was transferred to the operating room and placed on the operating room table in supine position with the Left upper extremity on an armboard.  Bier block anesthesia was induced by the anesthesiologist.  Left upper extremity was prepped and draped in normal sterile orthopaedic fashion.  A surgical pause was performed between the surgeons, anesthesia, and operating room staff, and all were in agreement as to the patient, procedure, and  site of procedure.  Tourniquet at the proximal aspect of the forearm had been inflated for the Bier block  Incision was made over the transverse carpal ligament and carried into the subcutaneous tissues by spreading technique.  Bipolar electrocautery was used to obtain hemostasis.  The palmar fascia was sharply incised.  The transverse carpal ligament was identified and sharply incised.  It was incised distally first.  The flexor tendons were identified.  The flexor tendon to the ring finger was identified and retracted radially.  The transverse carpal ligament was then incised proximally.  Scissors were used to split the distal aspect of the volar antebrachial fascia.  A finger was placed into the wound to ensure complete decompression, which was the case.  The nerve was examined.  It was flattened and hyperemic and there was an hourglass deformity.  The motor branch was identified and was intact.  The wound was copiously irrigated with sterile saline.  It was then closed with 4-0 nylon in a horizontal mattress fashion.  It was injected with 0.25% plain Marcaine to aid in postoperative analgesia.  It was dressed with sterile Xeroform, 4x4s, an ABD, and wrapped with Kerlix and an Ace bandage.  Tourniquet was deflated at 27 minutes.  Fingertips were pink with brisk capillary refill after deflation of the tourniquet.  Operative drapes were broken down.  The patient was awoken from anesthesia safely.  He was transferred back to stretcher and taken to the PACU in stable condition.  I will see him  back in the office in 1 week for postoperative followup.  I will give him a prescription for Norco 5/325 1-2 tabs PO q6 hours prn pain, dispense # 15.    Leanora Cover, MD Electronically signed, 10/23/20

## 2020-10-23 NOTE — Discharge Instructions (Addendum)

## 2020-10-23 NOTE — Anesthesia Postprocedure Evaluation (Signed)
Anesthesia Post Note  Patient: DORVIN KREBS  Procedure(s) Performed: LEFT CARPAL TUNNEL RELEASE (Left: Hand)     Patient location during evaluation: PACU Anesthesia Type: Bier Block Level of consciousness: awake and alert Pain management: pain level controlled Vital Signs Assessment: post-procedure vital signs reviewed and stable Respiratory status: spontaneous breathing, nonlabored ventilation and respiratory function stable Cardiovascular status: blood pressure returned to baseline and stable Postop Assessment: no apparent nausea or vomiting Anesthetic complications: no   No notable events documented.  Last Vitals:  Vitals:   10/23/20 1600 10/23/20 1622  BP: (!) 122/58 105/70  Pulse: (!) 52 68  Resp: 14 14  Temp:  36.6 C  SpO2: 97% 95%    Last Pain:  Vitals:   10/23/20 1622  TempSrc:   PainSc: 0-No pain                 Lynda Rainwater

## 2020-10-23 NOTE — Anesthesia Preprocedure Evaluation (Signed)
Anesthesia Evaluation  Patient identified by MRN, date of birth, ID band Patient awake    Reviewed: Allergy & Precautions, H&P , NPO status , Patient's Chart, lab work & pertinent test results  Airway Mallampati: II  TM Distance: >3 FB Neck ROM: Full    Dental  (+) Teeth Intact, Dental Advisory Given   Pulmonary neg pulmonary ROS,    Pulmonary exam normal breath sounds clear to auscultation       Cardiovascular hypertension, Pt. on medications negative cardio ROS Normal cardiovascular exam Rhythm:Regular Rate:Normal     Neuro/Psych Seizures -,  TIAnegative psych ROS   GI/Hepatic negative GI ROS, Neg liver ROS,   Endo/Other  negative endocrine ROS  Renal/GU Renal InsufficiencyRenal disease  negative genitourinary   Musculoskeletal  (+) Arthritis , Osteoarthritis,    Abdominal   Peds  Hematology negative hematology ROS (+)   Anesthesia Other Findings   Reproductive/Obstetrics                             Anesthesia Physical  Anesthesia Plan  ASA: 3  Anesthesia Plan: Bier Block and Bier Block-LIDOCAINE ONLY   Post-op Pain Management:    Induction: Intravenous  PONV Risk Score and Plan: 1 and Ondansetron and Treatment may vary due to age or medical condition  Airway Management Planned: Simple Face Mask  Additional Equipment:   Intra-op Plan:   Post-operative Plan:   Informed Consent: I have reviewed the patients History and Physical, chart, labs and discussed the procedure including the risks, benefits and alternatives for the proposed anesthesia with the patient or authorized representative who has indicated his/her understanding and acceptance.     Dental advisory given  Plan Discussed with: CRNA  Anesthesia Plan Comments:         Anesthesia Quick Evaluation

## 2020-10-23 NOTE — Anesthesia Procedure Notes (Signed)
Anesthesia Regional Block: Bier block (IV Regional)   Pre-Anesthetic Checklist: , timeout performed,  Correct Patient, Correct Site, Correct Laterality,  Correct Procedure,, site marked,  Surgical consent,  At surgeon's request  Laterality: Left         Needles:  Injection technique: Single-shot  Needle Type: Other      Needle Gauge: 22     Additional Needles:   Procedures:,,,,, intact distal pulses, Esmarch exsanguination,  Single tourniquet utilized    Narrative:  Start time: 10/23/2020 3:04 PM End time: 10/23/2020 3:05 PM  Performed by: Personally

## 2020-10-23 NOTE — H&P (Addendum)
Colin Lewis is an 87 y.o. male.   Chief Complaint: carpal tunnel syndrome HPI: 87 yo male with numbness and tingling in hands.  Nocturnal symptoms.  Positive nerve conduction studies.  He wishes to have left carpal tunnel release.  Allergies: No Known Allergies  Past Medical History:  Diagnosis Date   History of kidney stones    History of urinary tract obstruction    Hyperlipidemia    Hypertension    Partial symptomatic epilepsy with simple partial seizures, intractable, without status epilepticus ([place]) 02/2020   Prostate cancer ([place])    Skin cancer    ear   Thoracic spondylosis    TIA (transient ischemic attack) 02/2020   Wears glasses     Past Surgical History:  Procedure Laterality Date   CATARACT EXTRACTION W/ INTRAOCULAR LENS  IMPLANT, BILATERAL  2013   CRYOABLATION N/A 09/13/2013   Procedure: CRYO ABLATION PROSTATE;  Surgeon: Ailene Rud, MD;  Location: [facility];  Service: Urology;  Laterality: N/A;   CYSTO/  RIGHT RETROGRADE PYELOGRAM/  RIGHT URETERAL STENT PLACEMENT  01-18-2001   CYSTO/ RIGHT RETROGRADE PYELOGRAM/ RIGHT URETEROSCOPIC LASER LITHOTRIPSY STONE EXTRACTION / STENT PLACEMENT  10-28-2007   DILATATION MEATAL STENOSIS/  CYSTOSCOPY/  LEFT RETROGRADE PYELOGRAM/ LEFT URETEROSOPIC LASER LITHOTRIPSY STONE EXTRACTION / STENT PLACEMENT  10-04-1999   EXTRACORPOREAL SHOCK WAVE LITHOTRIPSY Left 05-18-2012   LEFT URETEROSCOPIC STONE EXTRACTION  10-13-2001 &  04-10-2007   MOHS SURGERY Left 02/16/2020    Family History: Family History  Problem Relation Age of Onset   Kidney failure Mother    Other Father        appendix infection   Throat cancer Sister     Social History:   reports that he has never smoked. He has never used smokeless tobacco. He reports that he does not drink alcohol and does not use drugs.  Medications: No medications prior to admission.    No results found for this or any previous visit (from the past 48  hour(s)).  No results found.   A comprehensive review of systems was negative.  Height '5\' 4"'$  (1.626 m), weight 69.4 kg.  General appearance: alert, cooperative, and appears stated age Head: Normocephalic, without obvious abnormality, atraumatic Neck: supple, symmetrical, trachea midline Cardio: regular rate and rhythm Resp: clear to auscultation bilaterally Extremities: Intact sensation and capillary refill all digits.  +epl/fpl/io.  No wounds.  Pulses: 2+ and symmetric Skin: Skin color, texture, turgor normal. No rashes or lesions Neurologic: Grossly normal Incision/Wound: none  Assessment/Plan Left carpal tunnel release.  Non operative and operative treatment options have been discussed with the patient and patient wishes to proceed with operative treatment. Risks, benefits, and alternatives of surgery have been discussed and the patient agrees with the plan of care.   Leanora Cover 10/23/2020, 12:29 PM  Addendum (11/02/20): correct laterality

## 2020-10-23 NOTE — Transfer of Care (Signed)
Immediate Anesthesia Transfer of Care Note  Patient: RYKAR WITTENBORN  Procedure(s) Performed: LEFT CARPAL TUNNEL RELEASE (Left: Hand)  Patient Location: PACU  Anesthesia Type:Bier Block  Level of Consciousness: awake, alert  and oriented  Airway & Oxygen Therapy: Patient Spontanous Breathing and Patient connected to face mask oxygen  Post-op Assessment: Report given to RN and Post -op Vital signs reviewed and stable  Post vital signs: Reviewed and stable  Last Vitals:  Vitals Value Taken Time  BP 113/73 10/23/20 1537  Temp 36.4 C 10/23/20 1537  Pulse 72 10/23/20 1542  Resp 0 10/23/20 1542  SpO2 98 % 10/23/20 1542  Vitals shown include unvalidated device data.  Last Pain:  Vitals:   10/23/20 1537  TempSrc:   PainSc: 0-No pain         Complications: No notable events documented.

## 2020-10-24 ENCOUNTER — Encounter (HOSPITAL_BASED_OUTPATIENT_CLINIC_OR_DEPARTMENT_OTHER): Payer: Self-pay | Admitting: Orthopedic Surgery

## 2020-10-30 ENCOUNTER — Encounter (INDEPENDENT_AMBULATORY_CARE_PROVIDER_SITE_OTHER): Payer: Medicare Other | Admitting: Ophthalmology

## 2020-10-30 ENCOUNTER — Other Ambulatory Visit: Payer: Self-pay

## 2020-10-30 DIAGNOSIS — I10 Essential (primary) hypertension: Secondary | ICD-10-CM | POA: Diagnosis not present

## 2020-10-30 DIAGNOSIS — H43813 Vitreous degeneration, bilateral: Secondary | ICD-10-CM | POA: Diagnosis not present

## 2020-10-30 DIAGNOSIS — H35033 Hypertensive retinopathy, bilateral: Secondary | ICD-10-CM | POA: Diagnosis not present

## 2020-10-30 DIAGNOSIS — D3131 Benign neoplasm of right choroid: Secondary | ICD-10-CM

## 2021-03-29 ENCOUNTER — Other Ambulatory Visit: Payer: Self-pay

## 2021-03-29 ENCOUNTER — Encounter: Payer: Self-pay | Admitting: Sports Medicine

## 2021-03-29 ENCOUNTER — Ambulatory Visit (INDEPENDENT_AMBULATORY_CARE_PROVIDER_SITE_OTHER): Payer: Medicare Other | Admitting: Sports Medicine

## 2021-03-29 DIAGNOSIS — R4701 Aphasia: Secondary | ICD-10-CM

## 2021-03-29 DIAGNOSIS — B351 Tinea unguium: Secondary | ICD-10-CM

## 2021-03-29 DIAGNOSIS — M79674 Pain in right toe(s): Secondary | ICD-10-CM

## 2021-03-29 DIAGNOSIS — M79675 Pain in left toe(s): Secondary | ICD-10-CM

## 2021-03-29 DIAGNOSIS — I739 Peripheral vascular disease, unspecified: Secondary | ICD-10-CM

## 2021-03-29 NOTE — Progress Notes (Signed)
Subjective: Colin Lewis is a 88 y.o. male patient seen today in office with complaint of mildly painful thickened and elongated toenails; unable to trim due to arthritis in his hands. Patient denies history of known diabetes or neuropathy.  Patient has no other pedal complaints at this time.   Patient Active Problem List   Diagnosis Date Noted   Aphasia 03/13/2020   Prostate cancer ([place]) 03/13/2020   Cellulitis of left ear 03/13/2020   HTN (hypertension) 03/13/2020   HLD (hyperlipidemia) 03/13/2020   Prolonged QT interval 03/13/2020   CKD (chronic kidney disease), stage III ([place]) 03/13/2020   Chronic diastolic CHF (congestive heart failure) ([place]) 03/13/2020   Chronic right-sided low back pain with right-sided sciatica 07/24/2016    Current Outpatient Medications on File Prior to Visit  Medication Sig Dispense Refill   albuterol (VENTOLIN HFA) 108 (90 Base) MCG/ACT inhaler Inhale into the lungs every 6 (six) hours as needed for wheezing or shortness of breath.     aspirin EC 81 MG tablet Take 81 mg by mouth daily. Swallow whole.     cholecalciferol (VITAMIN D3) 25 MCG (1000 UNIT) tablet Take 1,000 Units by mouth daily.     divalproex (DEPAKOTE) 250 MG DR tablet Take 1 tablet (250 mg total) by mouth 2 (two) times daily. 180 tablet 4   finasteride (PROSCAR) 5 MG tablet Take 5 mg by mouth daily.     HYDROcodone-acetaminophen (NORCO) 5-325 MG tablet 1-2 tabs po q6 hours prn pain 20 tablet 0   Magnesium 400 MG TABS Take 400 mg by mouth daily.     simvastatin (ZOCOR) 20 MG tablet Take 20 mg by mouth daily.     telmisartan (MICARDIS) 40 MG tablet Take 40 mg by mouth daily.     No current facility-administered medications on file prior to visit.    No Known Allergies  Objective: Physical Exam  General: Well developed, nourished, no acute distress, awake, alert and oriented x 3  Vascular: Dorsalis pedis artery 1/4 bilateral, Posterior tibial artery 0/4 bilateral, skin temperature warm to  warm proximal to distal bilateral lower extremities, hyperpigmentation brawny in nature noted bilateral, minimal varicosities, scant pedal hair present bilateral.  Neurological: Gross sensation present via light touch bilateral.   Dermatological: Skin is warm, dry, and supple bilateral, Nails 1-10 are tender, long, thick, and discolored with mild subungal debris, no webspace macerations present bilateral, no open lesions present bilateral, no callus/corns/hyperkeratotic tissue present bilateral. No signs of infection bilateral.  Musculoskeletal: No symptomatic boney deformities noted bilateral. Muscular strength within normal limits without painon range of motion. No pain with calf compression bilateral.  Assessment and Plan:  Problem List Items Addressed This Visit       Other   Aphasia   Other Visit Diagnoses     Pain due to onychomycosis of toenails of both feet    -  Primary   PVD (peripheral vascular disease) ([place])           -Examined patient.  -Discussed treatment options for painful mycotic nails. -Mechanically debrided and reduced painful mycotic nails with sterile nail nipper and dremel nail file without incident. -Patient to return in 3 months for follow up evaluation or sooner if symptoms worsen.  Landis Martins, DPM

## 2021-05-07 ENCOUNTER — Ambulatory Visit (INDEPENDENT_AMBULATORY_CARE_PROVIDER_SITE_OTHER): Payer: Medicare Other | Admitting: Diagnostic Neuroimaging

## 2021-05-07 ENCOUNTER — Encounter: Payer: Self-pay | Admitting: Diagnostic Neuroimaging

## 2021-05-07 VITALS — BP 142/74 | HR 58 | Ht 65.0 in | Wt 160.0 lb

## 2021-05-07 DIAGNOSIS — G40109 Localization-related (focal) (partial) symptomatic epilepsy and epileptic syndromes with simple partial seizures, not intractable, without status epilepticus: Secondary | ICD-10-CM

## 2021-05-07 MED ORDER — DIVALPROEX SODIUM 250 MG PO DR TAB: 250.0000 mg | DELAYED_RELEASE_TABLET | Freq: Two times a day (BID) | ORAL | 4 refills | Status: DC

## 2021-05-07 NOTE — Progress Notes (Signed)
[facility]  PATIENT: Colin Lewis DOB: July 09, 1932  REFERRING CLINICIAN: Jilda Panda, MD HISTORY FROM: patient  REASON FOR VISIT: follow up   HISTORICAL  CHIEF COMPLAINT:  Chief Complaint  Patient presents with   Follow-up    Rm 7 here for yearly f/u. Pt reports he has been doing well over the last year. Reports he does feel drowsy at times during the day.     HISTORY OF PRESENT ILLNESS:   UPDATE (05/07/21, VRP): Since last visit, doing well. No more spells or sz. Tolerating divalproex 27m twice a day.    PRIOR HPI (05/03/20): 88year old male here for evaluation of transient aphasia.  03/12/2020 patient was at home when he was noted to have difficulty getting his words out.  Patient went to the hospital for evaluation.  Symptoms lasted almost 24 hours.  Stroke and seizure work-up were obtained.  MRI was negative for acute stroke.  EEG showed some slowing over left temporal region.  Patient was empirically started on antiseizure medication.  Since that time patient is doing well.  No further events or spells.  He is on aspirin, statin, blood pressure control for stroke prevention.  He is on Depakote 250 mg twice a day (he reduced to twice a day on his own).    REVIEW OF SYSTEMS: Full 14 system review of systems performed and negative with exception of: As per HPI.  ALLERGIES: No Known Allergies  HOME MEDICATIONS: Outpatient Medications Prior to Visit  Medication Sig Dispense Refill   albuterol (VENTOLIN HFA) 108 (90 Base) MCG/ACT inhaler Inhale into the lungs every 6 (six) hours as needed for wheezing or shortness of breath.     aspirin EC 81 MG tablet Take 81 mg by mouth daily. Swallow whole.     cholecalciferol (VITAMIN D3) 25 MCG (1000 UNIT) tablet Take 1,000 Units by mouth daily.     finasteride (PROSCAR) 5 MG tablet Take 5 mg by mouth daily.     Magnesium 400 MG TABS Take 400 mg by mouth daily.     simvastatin (ZOCOR) 20 MG tablet Take 20 mg by  mouth daily.     telmisartan (MICARDIS) 40 MG tablet Take 40 mg by mouth daily.     divalproex (DEPAKOTE) 250 MG DR tablet Take 1 tablet (250 mg total) by mouth 2 (two) times daily. 180 tablet 4   HYDROcodone-acetaminophen (NORCO) 5-325 MG tablet 1-2 tabs po q6 hours prn pain (Patient not taking: Reported on 05/07/2021) 20 tablet 0   No facility-administered medications prior to visit.    PAST MEDICAL HISTORY: Past Medical History:  Diagnosis Date   History of kidney stones    History of urinary tract obstruction    Hyperlipidemia    Hypertension    Partial symptomatic epilepsy with simple partial seizures, intractable, without status epilepticus ([place] 02/2020   [place]    Skin cancer    ear   Thoracic spondylosis    TIA (transient ischemic attack) 02/2020   Wears glasses     PAST SURGICAL HISTORY: Past Surgical History:  Procedure Laterality Date   CARPAL TUNNEL RELEASE Left 10/23/2020   Procedure: LEFT CARPAL TUNNEL RELEASE;  Surgeon: KLeanora Cover MD;  [facility]  Service: Orthopedics;  Laterality: Left;  Bier block   CATARACT EXTRACTION W/ INTRAOCULAR LENS  IMPLANT, BILATERAL  2013   CRYOABLATION N/A 09/13/2013   Procedure: CRYO ABLATION PROSTATE;  Surgeon: SAilene Rud MD;  [facility];  Service: Urology;  Laterality: N/A;   CYSTO/  RIGHT RETROGRADE PYELOGRAM/  RIGHT URETERAL STENT PLACEMENT  01-18-2001   CYSTO/ RIGHT RETROGRADE PYELOGRAM/ RIGHT URETEROSCOPIC LASER LITHOTRIPSY STONE EXTRACTION / STENT PLACEMENT  10-28-2007   DILATATION MEATAL STENOSIS/  CYSTOSCOPY/  LEFT RETROGRADE PYELOGRAM/ LEFT URETEROSOPIC LASER LITHOTRIPSY STONE EXTRACTION / STENT PLACEMENT  10-04-1999   EXTRACORPOREAL SHOCK WAVE LITHOTRIPSY Left 05-18-2012   LEFT URETEROSCOPIC STONE EXTRACTION  10-13-2001 &  04-10-2007   MOHS SURGERY Left 02/16/2020    FAMILY HISTORY: Family History  Problem Relation Age of Onset   Kidney  failure Mother    Other Father        appendix infection   Throat cancer Sister     SOCIAL HISTORY: Social History   Socioeconomic History   Marital status: Married    Spouse name: Peter Congo   Number of children: 3   Years of education: Not on file   Highest education level: Some college, no degree  Occupational History   Not on file  Tobacco Use   Smoking status: Never   Smokeless tobacco: Never  Substance and Sexual Activity   Alcohol use: No   Drug use: No   Sexual activity: Not Currently  Other Topics Concern   Not on file  Social History Narrative   05/03/20 lives with wife   Social Determinants of Health   Financial Resource Strain: Not on file  Food Insecurity: Not on file  Transportation Needs: Not on file  Physical Activity: Not on file  Stress: Not on file  Social Connections: Not on file  Intimate Partner Violence: Not on file     PHYSICAL EXAM  GENERAL EXAM/CONSTITUTIONAL: Vitals:  Vitals:   05/07/21 1327  BP: (!) 142/74  Pulse: (!) 58  Weight: 160 lb (72.6 kg)  Height: _0  (1.651 m)   Body mass index is 26.63 kg/m. Wt Readings from Last 3 Encounters:  05/07/21 160 lb (72.6 kg)  10/23/20 148 lb 9.4 oz (67.4 kg)  05/03/20 151 lb (68.5 kg)   Patient is in no distress; well developed, nourished and groomed; neck is supple  CARDIOVASCULAR: Examination of carotid arteries is normal; no carotid bruits Regular rate and rhythm, no murmurs Examination of peripheral vascular system by observation and palpation is normal  EYES: Ophthalmoscopic exam of optic discs and posterior segments is normal; no papilledema or hemorrhages No results found.  MUSCULOSKELETAL: Gait, strength, tone, movements noted in Neurologic exam below  NEUROLOGIC: MENTAL STATUS:  No flowsheet data found. awake, alert, oriented to person, place and time recent and remote memory intact normal attention and concentration language fluent, comprehension intact, naming  intact fund of knowledge appropriate  CRANIAL NERVE:  2nd - no papilledema on fundoscopic exam 2nd, 3rd, 4th, 6th - pupils equal and reactive to light, visual fields full to confrontation, extraocular muscles intact, no nystagmus 5th - facial sensation symmetric 7th - facial strength symmetric 8th - hearing intact 9th - palate elevates symmetrically, uvula midline 11th - shoulder shrug symmetric 12th - tongue protrusion midline  MOTOR:  normal bulk and tone, full strength in the BUE, BLE  SENSORY:  normal and symmetric to light touch, temperature, vibration  COORDINATION:  finger-nose-finger, fine finger movements normal  REFLEXES:  deep tendon reflexes TRACE and symmetric  GAIT/STATION:  narrow based gait     DIAGNOSTIC DATA (LABS, IMAGING, TESTING) - I reviewed patient records, labs, notes, testing and imaging myself where available.  Lab Results  Component Value Date  WBC 6.4 03/13/2020   HGB 13.4 03/13/2020   HCT 38.9 (L) 03/13/2020   MCV 92.2 03/13/2020   PLT 200 03/13/2020      Component Value Date/Time   NA 139 03/13/2020 0418   K 3.5 03/13/2020 0418   CL 103 03/13/2020 0418   CO2 27 03/13/2020 0418   GLUCOSE 106 (H) 03/13/2020 0418   BUN 24 (H) 03/13/2020 0418   CREATININE 1.47 (H) 03/13/2020 0418   CALCIUM 10.3 03/13/2020 0418   PROT 6.3 (L) 03/13/2020 0418   ALBUMIN 3.4 (L) 03/13/2020 0418   AST 34 03/13/2020 0418   ALT 15 03/13/2020 0418   ALKPHOS 91 03/13/2020 0418   BILITOT 0.7 03/13/2020 0418   GFRNONAA 46 (L) 03/13/2020 0418   GFRAA (L) 10/28/2007 0920    40        The eGFR has been calculated using the MDRD equation. This calculation has not been validated in all clinical   No results found for: CHOL, HDL, LDLCALC, LDLDIRECT, TRIG, CHOLHDL No results found for: HGBA1C No results found for: VITAMINB12 No results found for: TSH    03/12/20 MRI brain 1. No acute intracranial abnormality. 2. Mild chronic small vessel ischemic  disease and cerebral atrophy. 3. Small chronic right cerebellar infarct.  03/12/20 MRI brain w - No evidence for metastatic or meningeal disease to the brain. No pathologic enhancement.  03/13/20 CTA head / neck - No large vessel occlusion, high-grade narrowing, dissection or aneurysm. - No acute intracranial process. Mild chronic microvascular ischemic changes.  03/13/20 EEG - This study is suggestive of nonspecific cortical dysfunction in left temporal region. No seizures or epileptiform discharges were seen throughout the recording.  03/13/20 TTE - Normal biventricular function without  evidence of hemodynamically significant valvular heart disease.      ASSESSMENT AND PLAN  88 y.o. year old male here with:  Dx:  1. Partial symptomatic epilepsy with simple partial seizures, not intractable, without status epilepticus ([place])      PLAN:  TRANSIENT APHASIA (Mar 12, 2020 --> ddx: TIA vs partial seizure) - (was started on depakote 271m three times a day when hospitalized; patient reduced to 2549mtwice a day on his own; no further spells since Dec 2021) - continue depakote 25023mwice a day - continue aspirin, BP control, statin for stroke prevention  Meds ordered this encounter  Medications   divalproex (DEPAKOTE) 250 MG DR tablet    Sig: Take 1 tablet (250 mg total) by mouth 2 (two) times daily.    Dispense:  180 tablet    Refill:  4   Return in about 1 year (around 05/07/2022) for MyChart visit (15 min) or telephone visit.  I reviewed images, labs, notes, records myself. I summarized findings and reviewed with patient, for this high risk condition (TIA vs seizure) requiring high complexity decision making.     VIKPenni BombardD 05/12/81/0940:57:68 Certified in Neurology, Neurophysiology and Neuroimaging  [facility] [id number][email or web address] [id number]

## 2021-05-23 DIAGNOSIS — J309 Allergic rhinitis, unspecified: Secondary | ICD-10-CM | POA: Insufficient documentation

## 2021-05-23 DIAGNOSIS — J342 Deviated nasal septum: Secondary | ICD-10-CM | POA: Insufficient documentation

## 2021-06-05 ENCOUNTER — Other Ambulatory Visit: Payer: Self-pay | Admitting: Diagnostic Neuroimaging

## 2021-06-06 ENCOUNTER — Other Ambulatory Visit: Payer: Self-pay | Admitting: Diagnostic Neuroimaging

## 2021-06-07 ENCOUNTER — Encounter: Payer: Self-pay | Admitting: Sports Medicine

## 2021-06-07 ENCOUNTER — Other Ambulatory Visit: Payer: Self-pay

## 2021-06-07 ENCOUNTER — Ambulatory Visit (INDEPENDENT_AMBULATORY_CARE_PROVIDER_SITE_OTHER): Payer: Medicare Other | Admitting: Sports Medicine

## 2021-06-07 DIAGNOSIS — I739 Peripheral vascular disease, unspecified: Secondary | ICD-10-CM

## 2021-06-07 DIAGNOSIS — M79675 Pain in left toe(s): Secondary | ICD-10-CM | POA: Diagnosis not present

## 2021-06-07 DIAGNOSIS — R4701 Aphasia: Secondary | ICD-10-CM

## 2021-06-07 DIAGNOSIS — M79674 Pain in right toe(s): Secondary | ICD-10-CM

## 2021-06-07 DIAGNOSIS — B351 Tinea unguium: Secondary | ICD-10-CM

## 2021-06-07 NOTE — Progress Notes (Signed)
Subjective: ?Colin Lewis is a 88 y.o. male patient seen today in office with complaint of mildly painful thickened and elongated toenails; unable to trim due to arthritis in his hands as previous. No other issues noted.  ? ?Patient Active Problem List  ? Diagnosis Date Noted  ? Aphasia 03/13/2020  ? Prostate cancer ([place]) 03/13/2020  ? Cellulitis of left ear 03/13/2020  ? HTN (hypertension) 03/13/2020  ? HLD (hyperlipidemia) 03/13/2020  ? Prolonged QT interval 03/13/2020  ? CKD (chronic kidney disease), stage III ([place]) 03/13/2020  ? Chronic diastolic CHF (congestive heart failure) ([place]) 03/13/2020  ? Chronic right-sided low back pain with right-sided sciatica 07/24/2016  ? ? ?Current Outpatient Medications on File Prior to Visit  ?Medication Sig Dispense Refill  ? albuterol (VENTOLIN HFA) 108 (90 Base) MCG/ACT inhaler Inhale into the lungs every 6 (six) hours as needed for wheezing or shortness of breath.    ? aspirin EC 81 MG tablet Take 81 mg by mouth daily. Swallow whole.    ? cholecalciferol (VITAMIN D3) 25 MCG (1000 UNIT) tablet Take 1,000 Units by mouth daily.    ? divalproex (DEPAKOTE) 250 MG DR tablet Take 1 tablet by mouth twice daily 180 tablet 0  ? finasteride (PROSCAR) 5 MG tablet Take 5 mg by mouth daily.    ? Magnesium 400 MG TABS Take 400 mg by mouth daily.    ? simvastatin (ZOCOR) 20 MG tablet Take 20 mg by mouth daily.    ? telmisartan (MICARDIS) 40 MG tablet Take 40 mg by mouth daily.    ? ?No current facility-administered medications on file prior to visit.  ? ? ?No Known Allergies ? ?Objective: ?Physical Exam ? ?General: Well developed, nourished, no acute distress, awake, alert and oriented x 3 ? ?Vascular: Dorsalis pedis artery 1/4 bilateral, Posterior tibial artery 0/4 bilateral, skin temperature warm to warm proximal to distal bilateral lower extremities, hyperpigmentation brawny in nature noted bilateral, minimal varicosities, scant pedal hair present bilateral. ? ?Neurological: Gross  sensation present via light touch bilateral.  ? ?Dermatological: Skin is warm, dry, and supple bilateral, Nails 1-10 are tender, long, thick, and discolored with mild subungal debris, no webspace macerations present bilateral, no open lesions present bilateral, no callus/corns/hyperkeratotic tissue present bilateral. No signs of infection bilateral. ? ?Musculoskeletal: No symptomatic boney deformities noted bilateral. Muscular strength within normal limits without painon range of motion. No pain with calf compression bilateral. ? ?Assessment and Plan:  ?Problem List Items Addressed This Visit   ? ?  ? Other  ? Aphasia  ? ?Other Visit Diagnoses   ? ? Pain due to onychomycosis of toenails of both feet    -  Primary  ? PVD (peripheral vascular disease) ([place])      ? ?  ? ? ?-Examined patient.  ?-Discussed treatment options for painful mycotic nails. ?-Mechanically debrided and reduced painful mycotic nails with sterile nail nipper and dremel nail file without incident. ?-Patient to return in 3 months for follow up evaluation or sooner if symptoms worsen. ? ?Landis Martins, DPM ? ?

## 2021-09-11 ENCOUNTER — Encounter: Payer: Self-pay | Admitting: Podiatry

## 2021-09-11 ENCOUNTER — Ambulatory Visit (INDEPENDENT_AMBULATORY_CARE_PROVIDER_SITE_OTHER): Payer: Medicare Other | Admitting: Podiatry

## 2021-09-11 DIAGNOSIS — B351 Tinea unguium: Secondary | ICD-10-CM

## 2021-09-11 DIAGNOSIS — I739 Peripheral vascular disease, unspecified: Secondary | ICD-10-CM | POA: Diagnosis not present

## 2021-09-11 DIAGNOSIS — M79674 Pain in right toe(s): Secondary | ICD-10-CM

## 2021-09-11 DIAGNOSIS — M79675 Pain in left toe(s): Secondary | ICD-10-CM | POA: Diagnosis not present

## 2021-09-11 DIAGNOSIS — L600 Ingrowing nail: Secondary | ICD-10-CM | POA: Diagnosis not present

## 2021-09-18 NOTE — Progress Notes (Signed)
  Subjective:  Patient ID: Colin Lewis, male    DOB: 1932-12-31,  MRN: [id number]  Davonta Stroot Baumler presents to clinic today for for at risk foot care. Patient has h/o PAD and painful elongated mycotic toenails 1-5 bilaterally which are tender when wearing enclosed shoe gear. Pain is relieved with periodic professional debridement.  New problem(s):  ingrown toenail to the lateral border right hallux. Patient states toe is sore. Denies any redness, drainage or swelling.  PCP is Jilda Panda, MD , and last visit was 3 months.  No Known Allergies  Review of Systems: Negative except as noted in the HPI.  Objective: No changes noted in today's physical examination. Vascular Examination: CFT <4 seconds b/l. DP pulses faintly palpable b/l. PT pulses diminished b/l. Digital hair absent. Skin temperature gradient warm to warm  b/l. No ischemia or gangrene. No cyanosis or clubbing noted b/l.        Neurological Examination: Sensation grossly intact b/l with 10 gram monofilament. Vibratory sensation intact b/l.   Dermatological Examination: Pedal skin thin, shiny and atrophic b/l. Toenails 1-5 b/l thick, discolored, elongated with subungual debris and pain on dorsal palpation. Incurvated nailplate medial border left hallux and both borders of right hallux.  Nail border hypertrophy absent. There is tenderness to palpation. Sign(s) of infection: no clinical signs of infection noted on examination today.. No hyperkeratotic nor porokeratotic lesions present on today's visit.  Musculoskeletal Examination: Muscle strength 5/5 to b/l LE. No pain, crepitus or joint limitation noted with ROM bilateral LE. No gross bony deformities bilaterally.  Radiographs: None  Assessment/Plan: 1. Pain due to onychomycosis of toenails of both feet   2. Ingrown toenail without infection   3. PVD (peripheral vascular disease) ([place])     -Patient was evaluated and treated. All patient's and/or POA's questions/concerns answered  on today's visit. -Patient to continue soft, supportive shoe gear daily. -Mycotic toenails 1-5 bilaterally were debrided in length and girth with sterile nail nippers and dremel without incident. -Offending nail border debrided and curretaged bilateral great toes utilizing sterile nail nipper and currette. Border cleansed with alcohol and triple antibiotic applied. No further treatment required by patient/caregiver. Call office if there are any concerns. -Patient/POA to call should there be question/concern in the interim.   Return in about 3 months (around 12/12/2021).  Marzetta Board, DPM

## 2021-10-31 ENCOUNTER — Encounter (INDEPENDENT_AMBULATORY_CARE_PROVIDER_SITE_OTHER): Payer: Medicare Other | Admitting: Ophthalmology

## 2021-11-05 ENCOUNTER — Encounter: Payer: Self-pay | Admitting: Podiatry

## 2021-11-05 ENCOUNTER — Other Ambulatory Visit (HOSPITAL_COMMUNITY): Payer: Self-pay | Admitting: Urology

## 2021-11-05 ENCOUNTER — Other Ambulatory Visit: Payer: Self-pay | Admitting: Urology

## 2021-11-05 DIAGNOSIS — C61 Malignant neoplasm of prostate: Secondary | ICD-10-CM

## 2021-11-13 ENCOUNTER — Encounter (INDEPENDENT_AMBULATORY_CARE_PROVIDER_SITE_OTHER): Payer: Medicare Other | Admitting: Ophthalmology

## 2021-11-13 DIAGNOSIS — H35033 Hypertensive retinopathy, bilateral: Secondary | ICD-10-CM | POA: Diagnosis not present

## 2021-11-13 DIAGNOSIS — D3131 Benign neoplasm of right choroid: Secondary | ICD-10-CM | POA: Diagnosis not present

## 2021-11-13 DIAGNOSIS — H43813 Vitreous degeneration, bilateral: Secondary | ICD-10-CM

## 2021-11-13 DIAGNOSIS — I10 Essential (primary) hypertension: Secondary | ICD-10-CM | POA: Diagnosis not present

## 2021-12-19 ENCOUNTER — Ambulatory Visit: Payer: Medicare Other | Admitting: Podiatry

## 2021-12-19 ENCOUNTER — Ambulatory Visit (INDEPENDENT_AMBULATORY_CARE_PROVIDER_SITE_OTHER): Payer: Medicare Other | Admitting: Podiatry

## 2021-12-19 DIAGNOSIS — I739 Peripheral vascular disease, unspecified: Secondary | ICD-10-CM

## 2021-12-19 DIAGNOSIS — M79675 Pain in left toe(s): Secondary | ICD-10-CM

## 2021-12-19 DIAGNOSIS — M79674 Pain in right toe(s): Secondary | ICD-10-CM

## 2021-12-19 DIAGNOSIS — B351 Tinea unguium: Secondary | ICD-10-CM

## 2021-12-19 NOTE — Progress Notes (Signed)
  Subjective:  Patient ID: Colin Lewis, male    DOB: May 02, 1932,  MRN: [id number]  Colin Lewis presents to clinic today for:  Chief Complaint  Patient presents with   Nail Problem   New problem(s): None.   PCP is Jilda Panda, MD.  Not on File  Review of Systems: Negative except as noted in the HPI.  Objective: No changes noted in today's physical examination.  Colin Lewis is a pleasant 88 y.o. male WD, WN in NAD. AAO x 3.  Vascular Examination: CFT <4 seconds b/l. DP pulses faintly palpable b/l. PT pulses diminished b/l. Digital hair absent. Skin temperature gradient warm to warm  b/l. No ischemia or gangrene. No cyanosis or clubbing noted b/l.        Neurological Examination: Sensation grossly intact b/l with 10 gram monofilament. Vibratory sensation intact b/l.   Dermatological Examination: Pedal skin thin, shiny and atrophic b/l. Toenails 1-5 b/l thick, discolored, elongated with subungual debris and pain on dorsal palpation.   Incurvated nailplate bilateral borders left hallux and both borders of right hallux.  Nail border hypertrophy absent. There is tenderness to palpation. Sign(s) of infection: no clinical signs of infection noted on examination today. No hyperkeratotic nor porokeratotic lesions present on today's visit.  Musculoskeletal Examination: Muscle strength 5/5 to b/l LE. No pain, crepitus or joint limitation noted with ROM bilateral LE. No gross bony deformities bilaterally.  Radiographs: None  Assessment/Plan: 1. Pain due to onychomycosis of toenails of both feet   2. PVD (peripheral vascular disease) ([place])     No orders of the defined types were placed in this encounter.   -Consent given for treatment as described below: -Examined patient. -Toenails 2-5 bilaterally debrided in length and girth without iatrogenic bleeding with sterile nail nipper and dremel.  -Offending nail border debrided and curretaged both borders of left hallux and both borders of  right hallux utilizing sterile nail nipper and currette. Border(s) cleansed with alcohol and triple antibiotic ointment applied. Patient/POA/Caregiver/Facility instructed to apply triple antibiotic ointment  to bilateral great toes once daily for 14 days. Call office if there are any concerns. -Patient/POA to call should there be question/concern in the interim.   Return in about 3 months (around 03/21/2022).  Marzetta Board, DPM

## 2021-12-23 ENCOUNTER — Encounter: Payer: Self-pay | Admitting: Podiatry

## 2021-12-31 ENCOUNTER — Encounter (HOSPITAL_COMMUNITY)
Admission: RE | Admit: 2021-12-31 | Discharge: 2021-12-31 | Disposition: A | Discharge status: Home / Self Care | Payer: Medicare Other | Source: Ambulatory Visit | Attending: Urology | Admitting: Urology

## 2021-12-31 ENCOUNTER — Encounter (HOSPITAL_COMMUNITY): Payer: Self-pay

## 2021-12-31 ENCOUNTER — Other Ambulatory Visit (HOSPITAL_COMMUNITY): Payer: Self-pay

## 2021-12-31 DIAGNOSIS — C61 Malignant neoplasm of prostate: Secondary | ICD-10-CM | POA: Diagnosis present

## 2021-12-31 MED ORDER — TECHNETIUM TC 99M MEDRONATE IV KIT
20.0000 | PACK | Freq: Once | INTRAVENOUS | Status: AC | PRN
  Administered 2021-12-31: 22 via INTRAVENOUS

## 2022-03-18 HISTORY — PX: SKIN CANCER EXCISION: SHX779

## 2022-04-15 ENCOUNTER — Ambulatory Visit (INDEPENDENT_AMBULATORY_CARE_PROVIDER_SITE_OTHER): Payer: Medicare Other | Admitting: Podiatry

## 2022-04-15 ENCOUNTER — Encounter: Payer: Self-pay | Admitting: Podiatry

## 2022-04-15 VITALS — BP 133/68

## 2022-04-15 DIAGNOSIS — M79675 Pain in left toe(s): Secondary | ICD-10-CM | POA: Diagnosis not present

## 2022-04-15 DIAGNOSIS — I739 Peripheral vascular disease, unspecified: Secondary | ICD-10-CM

## 2022-04-15 DIAGNOSIS — B351 Tinea unguium: Secondary | ICD-10-CM

## 2022-04-15 DIAGNOSIS — M79674 Pain in right toe(s): Secondary | ICD-10-CM

## 2022-04-15 NOTE — Progress Notes (Unsigned)
  Subjective:  Patient ID: Colin Lewis, male    DOB: 05/22/1932,  MRN: [id number]  Colin Lewis Job presents to clinic today for {jgcomplaint:23593}  Chief Complaint  Patient presents with   Nail Problem    RFC PCP-[facility] PCP VST- W weeks ago   New problem(s): None. {jgcomplaint:23593}  PCP is Jilda Panda, MD.  Not on File  Review of Systems: Negative except as noted in the HPI.  Objective: No changes noted in today's physical examination. Vitals:   04/15/22 1117  BP: 133/68   [name] is a pleasant 89 y.o. male {jgbodyhabitus:24098} AAO x 3.  Vascular Examination: CFT <4 seconds b/l. DP pulses faintly palpable b/l. PT pulses diminished b/l. Digital hair absent. Skin temperature gradient warm to warm  b/l. No ischemia or gangrene. No cyanosis or clubbing noted b/l.        Neurological Examination: Sensation grossly intact b/l with 10 gram monofilament. Vibratory sensation intact b/l.   Dermatological Examination: Pedal skin thin, shiny and atrophic b/l. Toenails 1-5 b/l thick, discolored, elongated with subungual debris and pain on dorsal palpation.   Incurvated nailplate bilateral borders left hallux and both borders of right hallux.  Nail border hypertrophy absent. There is tenderness to palpation. Sign(s) of infection: no clinical signs of infection noted on examination today. No hyperkeratotic nor porokeratotic lesions present on today's visit.  Musculoskeletal Examination: Muscle strength 5/5 to b/l LE. No pain, crepitus or joint limitation noted with ROM bilateral LE. No gross bony deformities bilaterally.  Radiographs: None  Assessment/Plan: No diagnosis found.  No orders of the defined types were placed in this encounter.   None {Jgplan:23602::"-Patient/POA to call should there be question/concern in the interim."}   Return in about 3 months (around 07/15/2022).  Marzetta Board, DPM

## 2022-05-07 ENCOUNTER — Ambulatory Visit (INDEPENDENT_AMBULATORY_CARE_PROVIDER_SITE_OTHER): Payer: Medicare Other | Admitting: Diagnostic Neuroimaging

## 2022-05-07 DIAGNOSIS — G40109 Localization-related (focal) (partial) symptomatic epilepsy and epileptic syndromes with simple partial seizures, not intractable, without status epilepticus: Secondary | ICD-10-CM | POA: Diagnosis not present

## 2022-05-07 MED ORDER — DIVALPROEX SODIUM 250 MG PO DR TAB: 250.0000 mg | DELAYED_RELEASE_TABLET | Freq: Two times a day (BID) | ORAL | 4 refills | Status: DC

## 2022-05-07 NOTE — Progress Notes (Signed)
[facility]  PATIENT: Colin Lewis DOB: 10/31/1932  REFERRING CLINICIAN: Jilda Panda, MD HISTORY FROM: patient  REASON FOR VISIT: follow up   HISTORICAL  CHIEF COMPLAINT:  Chief Complaint  Patient presents with   Seizures    HISTORY OF PRESENT ILLNESS:   UPDATE (05/07/22, VRP): Since last visit, doing well. No further events or seizures. Tolerating meds.    UPDATE (05/07/21, VRP): Since last visit, doing well. No more spells or sz. Tolerating divalproex 242m twice a day.    PRIOR HPI (05/03/20): 89year old male here for evaluation of transient aphasia.  03/12/2020 patient was at home when he was noted to have difficulty getting his words out.  Patient went to the hospital for evaluation.  Symptoms lasted almost 24 hours.  Stroke and seizure work-up were obtained.  MRI was negative for acute stroke.  EEG showed some slowing over left temporal region.  Patient was empirically started on antiseizure medication.  Since that time patient is doing well.  No further events or spells.  He is on aspirin, statin, blood pressure control for stroke prevention.  He is on Depakote 250 mg twice a day (he reduced to twice a day on his own).    REVIEW OF SYSTEMS: Full 14 system review of systems performed and negative with exception of: As per HPI.  ALLERGIES: Not on File  HOME MEDICATIONS: Outpatient Medications Prior to Visit  Medication Sig Dispense Refill   albuterol (VENTOLIN HFA) 108 (90 Base) MCG/ACT inhaler Inhale into the lungs every 6 (six) hours as needed for wheezing or shortness of breath.     aspirin EC 81 MG tablet Take 81 mg by mouth daily. Swallow whole.     cholecalciferol (VITAMIN D3) 25 MCG (1000 UNIT) tablet Take 1,000 Units by mouth daily.     clotrimazole-betamethasone (LOTRISONE) cream SMARTSIG:0.5 Inch(es) Topical Twice Daily     finasteride (PROSCAR) 5 MG tablet Take 1 tablet by mouth daily.     Magnesium 400 MG TABS Take 400 mg by mouth  daily.     simvastatin (ZOCOR) 20 MG tablet Take 1 tablet by mouth daily.     telmisartan (MICARDIS) 40 MG tablet Take 40 mg by mouth daily.     divalproex (DEPAKOTE) 250 MG DR tablet Take 1 tablet by mouth twice daily 180 tablet 0   No facility-administered medications prior to visit.     PHYSICAL EXAM  Telephone note    DIAGNOSTIC DATA (LABS, IMAGING, TESTING) - I reviewed patient records, labs, notes, testing and imaging myself where available.  Lab Results  Component Value Date   WBC 6.4 03/13/2020   HGB 13.4 03/13/2020   HCT 38.9 (L) 03/13/2020   MCV 92.2 03/13/2020   PLT 200 03/13/2020      Component Value Date/Time   NA 139 03/13/2020 0418   K 3.5 03/13/2020 0418   CL 103 03/13/2020 0418   CO2 27 03/13/2020 0418   GLUCOSE 106 (H) 03/13/2020 0418   BUN 24 (H) 03/13/2020 0418   CREATININE 1.47 (H) 03/13/2020 0418   CALCIUM 10.3 03/13/2020 0418   PROT 6.3 (L) 03/13/2020 0418   ALBUMIN 3.4 (L) 03/13/2020 0418   AST 34 03/13/2020 0418   ALT 15 03/13/2020 0418   ALKPHOS 91 03/13/2020 0418   BILITOT 0.7 03/13/2020 0418   GFRNONAA 46 (L) 03/13/2020 0418   GFRAA (L) 10/28/2007 0920    40        The eGFR has been calculated using the  MDRD equation. This calculation has not been validated in all clinical   No results found for: "CHOL", "HDL", "[place]", "LDLDIRECT", "TRIG", "CHOLHDL" No results found for: "HGBA1C" No results found for: "VITAMINB12" No results found for: "TSH"   03/12/20 MRI brain 1. No acute intracranial abnormality. 2. Mild chronic small vessel ischemic disease and cerebral atrophy. 3. Small chronic right cerebellar infarct.  03/12/20 MRI brain w - No evidence for metastatic or meningeal disease to the brain. No pathologic enhancement.  03/13/20 CTA head / neck - No large vessel occlusion, high-grade narrowing, dissection or aneurysm. - No acute intracranial process. Mild chronic microvascular ischemic changes.  03/13/20 EEG - This  study is suggestive of nonspecific cortical dysfunction in left temporal region. No seizures or epileptiform discharges were seen throughout the recording.  03/13/20 TTE - Normal biventricular function without  evidence of hemodynamically significant valvular heart disease.     ASSESSMENT AND PLAN  89 y.o. year old male here with:  Dx:  1. Partial symptomatic epilepsy with simple partial seizures, not intractable, without status epilepticus ([place])      PLAN:  TRANSIENT APHASIA (Mar 12, 2020 --> ddx: TIA vs partial seizure) - (was started on depakote 254m three times a day when hospitalized; patient reduced to 2523mtwice a day on his own; no further spells since Dec 2021) - continue depakote 25056mwice a day - continue aspirin, BP control, statin for stroke prevention  Meds ordered this encounter  Medications   divalproex (DEPAKOTE) 250 MG DR tablet    Sig: Take 1 tablet (250 mg total) by mouth 2 (two) times daily.    Dispense:  180 tablet    Refill:  4   Return in about 1 year (around 05/08/2023) for MyChart visit (15 min).  Virtual Visit via Telephone Note  I connected with Colin Lewis 05/07/22 at  1:30 PM EST by telephone and verified that I am speaking with the correct person using two identifiers.   I discussed the limitations, risks, security and privacy concerns of performing an evaluation and management service by telephone and the availability of in person appointments. I also discussed with the patient that there may be a patient responsible charge related to this service. The patient expressed understanding and agreed to proceed.   Patient is at home and I am at the office.   I spent 15 minutes of face-to-face and non-face-to-face time with patient.  This included previsit chart review, lab review, study review, order entry, electronic health record documentation, patient education.        VIKPenni BombardD [id number] Certified in Neurology,  Neurophysiology and Neuroimaging  [facility] [address] [id number]

## 2022-07-30 ENCOUNTER — Ambulatory Visit (INDEPENDENT_AMBULATORY_CARE_PROVIDER_SITE_OTHER): Payer: Medicare Other | Admitting: Podiatry

## 2022-07-30 DIAGNOSIS — I739 Peripheral vascular disease, unspecified: Secondary | ICD-10-CM | POA: Diagnosis not present

## 2022-07-30 DIAGNOSIS — M79675 Pain in left toe(s): Secondary | ICD-10-CM

## 2022-07-30 DIAGNOSIS — M79674 Pain in right toe(s): Secondary | ICD-10-CM

## 2022-07-30 DIAGNOSIS — B351 Tinea unguium: Secondary | ICD-10-CM

## 2022-08-02 ENCOUNTER — Encounter: Payer: Self-pay | Admitting: Podiatry

## 2022-08-02 NOTE — Progress Notes (Signed)
  Subjective:  Patient ID: Colin Lewis, male    DOB: 05-May-1932,  MRN: [id number]  Colin Lewis presents to clinic today for at risk foot care. Patient has h/o PAD and painful elongated mycotic toenails 1-5 bilaterally which are tender when wearing enclosed shoe gear. Pain is relieved with periodic professional debridement. His wife is present during today's visit.  Chief Complaint  Patient presents with   Nail Problem    RFC PCP-[facility] PCP VST- 2 months ago   New problem(s): None.   PCP is Ralene Ok, MD.  Review of Systems: Negative except as noted in the HPI.  Objective: No changes noted in today's physical examination. There were no vitals filed for this visit. Colin Lewis is a pleasant 89 y.o. male WD, WN in NAD. AAO x 3.  Vascular Examination: CFT <3 seconds b/l. DP pulses faintly palpable b/l. PT pulses nonpalpable b/l. Digital hair absent. Skin temperature gradient warm to warm b/l. No pain with calf compression. No ischemia or gangrene. No cyanosis or clubbing noted b/l.    Neurological Examination: Sensation grossly intact b/l with 10 gram monofilament. Vibratory sensation intact b/l.   Dermatological Examination: Pedal skin warm and supple b/l. No open wounds b/l. No interdigital macerations. Toenails 1-5 b/l thick, discolored, elongated with subungual debris and pain on dorsal palpation.    Musculoskeletal Examination: Normal muscle strength 5/5 to all lower extremity muscle groups bilaterally. No pain, crepitus or joint limitation noted with ROM b/l LE. No gross bony pedal deformities b/l. Patient ambulates independently without assistive aids.  Radiographs: None  Assessment/Plan: 1. Pain due to onychomycosis of toenails of both feet   2. PVD (peripheral vascular disease) ([facility])     No orders of the defined types were placed in this encounter.   -Consent given for treatment as described below: -Examined patient. -Continue supportive shoe gear daily. -Toenails  1-5 b/l were debrided in length and girth with sterile nail nippers and dremel without iatrogenic bleeding.  -Patient/POA to call should there be question/concern in the interim.   Return in about 9 weeks (around 10/01/2022).  Freddie Breech, DPM

## 2022-10-01 ENCOUNTER — Ambulatory Visit: Payer: Medicare Other | Admitting: Podiatry

## 2022-10-01 ENCOUNTER — Encounter: Payer: Self-pay | Admitting: Podiatry

## 2022-10-01 DIAGNOSIS — M79675 Pain in left toe(s): Secondary | ICD-10-CM

## 2022-10-01 DIAGNOSIS — B351 Tinea unguium: Secondary | ICD-10-CM | POA: Diagnosis not present

## 2022-10-01 DIAGNOSIS — M79674 Pain in right toe(s): Secondary | ICD-10-CM

## 2022-10-01 NOTE — Progress Notes (Signed)
       Subjective:  Patient ID: Colin Lewis, male    DOB: March 27, 1932,  MRN: [id number]   Colin Lewis presents to clinic today for:  Chief Complaint  Patient presents with   Nail Problem    Rfc   . Patient notes nails are thick, discolored, elongated and painful in shoegear when trying to ambulate.  His wife is with him today.  PCP is Ralene Ok, MD.  Not on File  Review of Systems: Negative except as noted in the HPI.  Objective:  There were no vitals filed for this visit.  Colin Lewis is a pleasant 89 y.o. male in NAD. AAO x 3.  Vascular Examination: Capillary refill time is 3-5 seconds to toes bilateral. Palpable pedal pulses b/l LE. Digital hair present b/l. No pedal edema b/l. Skin temperature gradient WNL b/l. No varicosities b/l. No cyanosis or clubbing noted b/l.   Dermatological Examination: Pedal skin with normal turgor, texture and tone b/l. No open wounds. No interdigital macerations b/l. Toenails x10 are 3mm thick, discolored, dystrophic with subungual debris. There is pain with compression of the nail plates.  They are elongated x10  Assessment/Plan: 1. Pain due to onychomycosis of toenails of both feet     The mycotic toenails were sharply debrided x10 with sterile nail nippers and a power debriding burr to decrease bulk/thickness and length.    Return in about 10 weeks (around 12/10/2022).   Clerance Lav, DPM, FACFAS [facility]     [address] [place], [place] [address]                Office [phone]  Fax [phone]

## 2022-11-11 ENCOUNTER — Encounter (INDEPENDENT_AMBULATORY_CARE_PROVIDER_SITE_OTHER): Payer: Medicare Other | Admitting: Ophthalmology

## 2022-11-11 DIAGNOSIS — D3131 Benign neoplasm of right choroid: Secondary | ICD-10-CM

## 2022-11-11 DIAGNOSIS — I10 Essential (primary) hypertension: Secondary | ICD-10-CM | POA: Diagnosis not present

## 2022-11-11 DIAGNOSIS — H35033 Hypertensive retinopathy, bilateral: Secondary | ICD-10-CM | POA: Diagnosis not present

## 2022-11-11 DIAGNOSIS — H43813 Vitreous degeneration, bilateral: Secondary | ICD-10-CM

## 2022-12-09 ENCOUNTER — Ambulatory Visit (INDEPENDENT_AMBULATORY_CARE_PROVIDER_SITE_OTHER): Payer: Medicare Other | Admitting: Podiatry

## 2022-12-09 DIAGNOSIS — M79675 Pain in left toe(s): Secondary | ICD-10-CM | POA: Diagnosis not present

## 2022-12-09 DIAGNOSIS — B351 Tinea unguium: Secondary | ICD-10-CM | POA: Diagnosis not present

## 2022-12-09 DIAGNOSIS — M79674 Pain in right toe(s): Secondary | ICD-10-CM | POA: Diagnosis not present

## 2022-12-09 DIAGNOSIS — I739 Peripheral vascular disease, unspecified: Secondary | ICD-10-CM

## 2022-12-09 DIAGNOSIS — L84 Corns and callosities: Secondary | ICD-10-CM | POA: Diagnosis not present

## 2022-12-09 NOTE — Progress Notes (Signed)
Subjective:  Patient ID: Colin Lewis, male    DOB: [birth year],  MRN: [id number]  Colin Lewis presents to clinic today for:  Chief Complaint  Patient presents with   Nail Problem    Nail trim   . Patient notes nails are thick and elongated, causing pain in shoe gear when ambulating.    PCP is Colin Ok, MD.  Last seen 6 weeks ago.  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   Prostate cancer ([facility])    Skin cancer    ear   Thoracic spondylosis    TIA (transient ischemic attack) 02/2020   Wears glasses    Objective:  DREAM RIEGEL is a pleasant 89 y.o. male in NAD. AAO x 3.  Vascular Examination: Patient has palpable DP pulse, absent PT pulse bilateral.  Delayed capillary refill bilateral toes.  Sparse digital hair bilateral.  Proximal to distal cooling WNL bilateral.    Dermatological Examination: Interspaces are clear with no open lesions noted bilateral.  Skin is shiny and atrophic bilateral.  Nails are 3-40mm thick, with yellowish/brown discoloration, subungual debris and distal onycholysis x10.  There is pain with compression of nails x10.  There are hyperkeratotic lesions noted plantarmedial aspect of 1st MPJ bilateral .  Patient qualifies for at-risk foot care because of PVD .  Assessment/Plan: 1. Pain due to onychomycosis of toenails of both feet   2. PVD (peripheral vascular disease) ([facility])   3. Callus of foot    Mycotic nails x10 were sharply debrided with sterile nail nippers and power debriding burr to decrease bulk and length.  Hyperkeratotic lesions x2 were shaved with #312 blade.   Return in about 10 weeks (around 02/17/2023) for RFC.   Clerance Lav, DPM, FACFAS [facility]     [address] [place], [place] [address]                Office [phone]  Fax [phone]

## 2022-12-11 ENCOUNTER — Ambulatory Visit: Payer: Medicare Other | Admitting: Podiatry

## 2023-02-18 ENCOUNTER — Ambulatory Visit (INDEPENDENT_AMBULATORY_CARE_PROVIDER_SITE_OTHER): Payer: Medicare Other | Admitting: Podiatry

## 2023-02-18 DIAGNOSIS — B351 Tinea unguium: Secondary | ICD-10-CM | POA: Diagnosis not present

## 2023-02-18 DIAGNOSIS — M79674 Pain in right toe(s): Secondary | ICD-10-CM | POA: Diagnosis not present

## 2023-02-18 DIAGNOSIS — M79675 Pain in left toe(s): Secondary | ICD-10-CM | POA: Diagnosis not present

## 2023-02-18 NOTE — Progress Notes (Unsigned)
    Subjective:  Patient ID: Colin Lewis, male    DOB: [birth year],  MRN: [id number]  Colin Lewis presents to clinic today for:  Chief Complaint  Patient presents with   Nail Problem    Patient is here or RFC   Patient notes nails are thick, discolored, elongated and painful in shoegear when trying to ambulate.    PCP is Ralene Ok, MD.  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   Prostate cancer (HCC)    Skin cancer    ear   Thoracic spondylosis    TIA (transient ischemic attack) 02/2020   Wears glasses     Past Surgical History:  Procedure Laterality Date   CARPAL TUNNEL RELEASE Left 10/23/2020   Procedure: LEFT CARPAL TUNNEL RELEASE;  Surgeon: Betha Loa, MD;  Location: [facility];  Service: Orthopedics;  Laterality: Left;  Bier block   CATARACT EXTRACTION W/ INTRAOCULAR LENS  IMPLANT, BILATERAL  2013   CRYOABLATION N/A 09/13/2013   Procedure: CRYO ABLATION PROSTATE;  Surgeon: Kathi Ludwig, MD;  Location: [facility];  Service: Urology;  Laterality: N/A;   CYSTO/  RIGHT RETROGRADE PYELOGRAM/  RIGHT URETERAL STENT PLACEMENT  01-18-2001   CYSTO/ RIGHT RETROGRADE PYELOGRAM/ RIGHT URETEROSCOPIC LASER LITHOTRIPSY STONE EXTRACTION / STENT PLACEMENT  10-28-2007   DILATATION MEATAL STENOSIS/  CYSTOSCOPY/  LEFT RETROGRADE PYELOGRAM/ LEFT URETEROSOPIC LASER LITHOTRIPSY STONE EXTRACTION / STENT PLACEMENT  10-04-1999   EXTRACORPOREAL SHOCK WAVE LITHOTRIPSY Left 05-18-2012   LEFT URETEROSCOPIC STONE EXTRACTION  10-13-2001 &  04-10-2007   MOHS SURGERY Left 02/16/2020   Review of Systems: Negative except as noted in the HPI.  Objective:  [name] is a pleasant 89 y.o. male in NAD. AAO x 3.  Vascular Examination: Capillary refill  time is 3-5 seconds to toes bilateral. 1/4 palpable pedal pulses b/l LE. Digital hair sparse b/l.  Skin temperature gradient WNL b/l. No varicosities b/l. No cyanosis noted b/l.   Dermatological Examination: Pedal skin with decreased turgor, texture and tone b/l. No open wounds. No interdigital macerations b/l. Toenails x10 are 3mm thick, discolored, dystrophic with subungual debris. There is pain with compression of the nail plates.  They are elongated x10  Assessment/Plan: 1. Pain due to onychomycosis of toenails of both feet     The mycotic toenails were sharply debrided x10 with sterile nail nippers and a power debriding burr to decrease bulk/thickness and length.    Return in about 10 weeks (around 04/29/2023) for RFC (with his wife).   Colin Lewis, DPM, FACFAS [facility]     [address] [place], [place] [address]                Office [phone]  Fax [phone]

## 2023-03-21 ENCOUNTER — Ambulatory Visit
Admission: EM | Admit: 2023-03-21 | Discharge: 2023-03-21 | Disposition: A | Discharge status: Home / Self Care | Payer: Medicare Other

## 2023-03-21 ENCOUNTER — Ambulatory Visit (INDEPENDENT_AMBULATORY_CARE_PROVIDER_SITE_OTHER): Payer: Medicare Other

## 2023-03-21 DIAGNOSIS — R051 Acute cough: Secondary | ICD-10-CM

## 2023-03-21 DIAGNOSIS — J189 Pneumonia, unspecified organism: Secondary | ICD-10-CM

## 2023-03-21 DIAGNOSIS — R053 Chronic cough: Secondary | ICD-10-CM

## 2023-03-21 LAB — POCT INFLUENZA A/B
Influenza A, POC: NEGATIVE
Influenza B, POC: NEGATIVE

## 2023-03-21 MED ORDER — AMOXICILLIN-POT CLAVULANATE 875-125 MG PO TABS: 1.0000 | ORAL_TABLET | Freq: Two times a day (BID) | ORAL | 0 refills | Status: DC

## 2023-03-21 MED ORDER — AZITHROMYCIN 250 MG PO TABS: 250.0000 mg | ORAL_TABLET | Freq: Every day | ORAL | 0 refills | Status: DC

## 2023-03-21 NOTE — ED Provider Notes (Signed)
 [facility]    CSN: [id number] Arrival date & time: 03/21/23  1333      History   Chief Complaint Chief Complaint  Patient presents with   Cough   Sore Throat   Generalized Body Aches    HPI Colin Lewis is a 89 y.o. male.   Patient here today for evaluation of cough, congestion, body aches that he has had for the last 5 days.  He reports that he is having some right rib pain from coughing.  He has a sore throat on occasion.  He reports symptoms have been improving somewhat.  He denies any shortness of breath or wheezing.  He has had his flu vaccine COVID-vaccine and pneumonia vaccine.  The history is provided by the patient.  Cough Associated symptoms: sore throat   Associated symptoms: no chills, no ear pain, no eye discharge, no fever, no shortness of breath and no wheezing   Sore Throat Pertinent negatives include no abdominal pain and no shortness of breath.    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   Prostate cancer (HCC)    Skin cancer    ear   Thoracic spondylosis    TIA (transient ischemic attack) 02/2020   Wears glasses     Patient Active Problem List   Diagnosis Date Noted   Allergic rhinitis 05/23/2021   Nasal septal deviation 05/23/2021   Aphasia 03/13/2020   Malignant neoplasm of prostate (HCC) 03/13/2020   Cellulitis of left ear 03/13/2020   HTN (hypertension) 03/13/2020   HLD (hyperlipidemia) 03/13/2020   Prolonged QT interval 03/13/2020   CKD (chronic kidney disease), stage III (HCC) 03/13/2020   Chronic diastolic CHF (congestive heart failure) (HCC) 03/13/2020   Chronic right-sided low back pain with right-sided sciatica 07/24/2016    Past Surgical History:  Procedure Laterality Date   CARPAL TUNNEL RELEASE Left  10/23/2020   Procedure: LEFT CARPAL TUNNEL RELEASE;  Surgeon: Murrell Drivers, MD;  Location: [facility];  Service: Orthopedics;  Laterality: Left;  Bier block   CATARACT EXTRACTION W/ INTRAOCULAR LENS  IMPLANT, BILATERAL  2013   CRYOABLATION N/A 09/13/2013   Procedure: CRYO ABLATION PROSTATE;  Surgeon: Arlena LILLETTE Gal, MD;  Location: [facility];  Service: Urology;  Laterality: N/A;   CYSTO/  RIGHT RETROGRADE PYELOGRAM/  RIGHT URETERAL STENT PLACEMENT  01-18-2001   CYSTO/ RIGHT RETROGRADE PYELOGRAM/ RIGHT URETEROSCOPIC LASER LITHOTRIPSY STONE EXTRACTION / STENT PLACEMENT  10-28-2007   DILATATION MEATAL STENOSIS/  CYSTOSCOPY/  LEFT RETROGRADE PYELOGRAM/ LEFT URETEROSOPIC LASER LITHOTRIPSY STONE EXTRACTION / STENT PLACEMENT  10-04-1999   EXTRACORPOREAL SHOCK WAVE LITHOTRIPSY Left 05-18-2012   LEFT URETEROSCOPIC STONE EXTRACTION  10-13-2001 &  04-10-2007   MOHS SURGERY Left 02/16/2020       Home Medications    Prior to Admission medications   Medication Sig Start Date End Date Taking? Authorizing Provider  amoxicillin -clavulanate (AUGMENTIN ) 875-125 MG tablet Take 1 tablet by mouth every 12 (twelve) hours. 03/21/23  Yes Billy Asberry FALCON, PA-C  aspirin  EC 81 MG tablet Take 81 mg by mouth daily. Swallow whole.   Yes [provider]  azithromycin  (ZITHROMAX ) 250 MG tablet Take 1 tablet (250 mg total) by mouth daily. Take first 2 tablets together, then 1 every day until finished.  03/21/23  Yes Billy Asberry FALCON, PA-C  cholecalciferol (VITAMIN D3) 25 MCG (1000 UNIT) tablet Take 1,000 Units by mouth daily.   Yes [provider]  divalproex  (DEPAKOTE ) 250 MG DR tablet Take 1 tablet (250 mg total) by mouth 2 (two) times daily. 05/07/22  Yes Penumalli, Vikram R, MD  finasteride  (PROSCAR ) 5 MG tablet Take 1 tablet by mouth daily. 05/25/20  Yes [provider]  guaiFENesin (MUCINEX PO) Take by mouth.   Yes [provider]  Magnesium 400 MG TABS  Take 400 mg by mouth daily.   Yes [provider]  simvastatin  (ZOCOR ) 20 MG tablet Take 1 tablet by mouth daily.   Yes [provider]  telmisartan (MICARDIS) 40 MG tablet Take 40 mg by mouth daily. 09/29/19  Yes [provider]  albuterol  (VENTOLIN  HFA) 108 (90 Base) MCG/ACT inhaler Inhale into the lungs every 6 (six) hours as needed for wheezing or shortness of breath.    [provider]  clotrimazole-betamethasone (LOTRISONE) cream SMARTSIG:0.5 Inch(es) Topical Twice Daily 09/05/21   [provider]  nystatin-triamcinolone ointment (MYCOLOG) Apply topically 2 (two) times daily as needed. 02/26/23   [provider]    Family History Family History  Problem Relation Age of Onset   Kidney failure Mother    Other Father        appendix infection   Throat cancer Sister     Social History Social History   Tobacco Use   Smoking status: Never    Passive exposure: Never   Smokeless tobacco: Never  Vaping Use   Vaping status: Never Used  Substance Use Topics   Alcohol use: No   Drug use: No     Allergies   Patient has no known allergies.   Review of Systems Review of Systems  Constitutional:  Negative for chills and fever.  HENT:  Positive for congestion and sore throat. Negative for ear pain.   Eyes:  Negative for discharge and redness.  Respiratory:  Positive for cough. Negative for shortness of breath and wheezing.   Gastrointestinal:  Negative for abdominal pain, nausea and vomiting.     Physical Exam Triage Vital Signs ED Triage Vitals  Encounter Vitals Group     BP 03/21/23 1447 (!) 147/70     Systolic BP Percentile --      Diastolic BP Percentile --      Pulse Rate 03/21/23 1447 79     Resp 03/21/23 1447 (!) 22     Temp 03/21/23 1447 (!) 97.5 F (36.4 C)     Temp Source 03/21/23 1447 Oral     SpO2 03/21/23 1447 94 %     Weight 03/21/23 1444 160 lb 0.9 oz (72.6 kg)     Height 03/21/23 1444 5' 5 (1.651 m)      Head Circumference --      Peak Flow --      Pain Score 03/21/23 1440 2     Pain Loc --      Pain Education --      Exclude from Growth Chart --    No data found.  Updated Vital Signs BP (!) 147/70 (BP Location: Right Arm) Comment: To recheck again later during visit.  Pulse 79   Temp (!) 97.5 F (36.4 C) (Oral)   Resp (!) 22 Comment: Reported to provider  Ht 5' 5 (1.651 m)   Wt 160 lb 0.9 oz (72.6 kg)   SpO2 94% Comment: Reported to Provider  BMI 26.63 kg/m   Visual Acuity Right Eye Distance:   Left Eye Distance:   Bilateral Distance:    Right Eye Near:   Left Eye Near:    Bilateral Near:     Physical Exam Vitals and nursing note reviewed.  Constitutional:      General: He is not in acute distress.    Appearance: Normal appearance. He is not ill-appearing.  HENT:     Head: Normocephalic and atraumatic.     Nose: Nose normal. No congestion.     Mouth/Throat:     Mouth: Mucous membranes are moist.     Pharynx: Oropharynx is clear. No oropharyngeal exudate or posterior oropharyngeal erythema.  Eyes:     Conjunctiva/sclera: Conjunctivae normal.  Cardiovascular:     Rate and Rhythm: Normal rate and regular rhythm.     Heart sounds: Normal heart sounds. No murmur heard. Pulmonary:     Effort: Pulmonary effort is normal. No respiratory distress.     Breath sounds: Normal breath sounds. No wheezing, rhonchi or rales.  Skin:    General: Skin is warm and dry.  Neurological:     Mental Status: He is alert.  Psychiatric:        Mood and Affect: Mood normal.        Thought Content: Thought content normal.      UC Treatments / Results  Labs (all labs ordered are listed, but only abnormal results are displayed) Labs Reviewed  POCT INFLUENZA A/B - Normal    EKG   Radiology DG Chest 2 View Result Date: 03/21/2023 CLINICAL DATA:  Cough and congestion EXAM: CHEST - 2 VIEW COMPARISON:  Chest x-Kaeding 07/05/2015 FINDINGS: The heart size and mediastinal contours  are within normal limits. Both lungs are clear. The visualized skeletal structures are unremarkable. IMPRESSION: No active cardiopulmonary disease. Electronically Signed   By: Greig Pique M.D.   On: 03/21/2023 16:46    Procedures Procedures (including critical care time)  Medications Ordered in UC Medications - No data to display  Initial Impression / Assessment and Plan / UC Course  I have reviewed the triage vital signs and the nursing notes.  Pertinent labs & imaging results that were available during my care of the patient were reviewed by me and considered in my medical decision making (see chart for details).    X-Rosenkranz read is clear however questionable infiltrate to right middle lobe.  Will treat with antibiotics given clinical suspicion and recommended follow-up if no gradual improvement with any further concerns.  Final Clinical Impressions(s) / UC Diagnoses   Final diagnoses:  Acute cough  Persistent cough  Pneumonia of right middle lobe due to infectious organism   Discharge Instructions   None    ED Prescriptions     Medication Sig Dispense Auth. Provider   amoxicillin -clavulanate (AUGMENTIN ) 875-125 MG tablet Take 1 tablet by mouth every 12 (twelve) hours. 14 tablet Billy Stabs F, PA-C   azithromycin  (ZITHROMAX ) 250 MG tablet Take 1 tablet (250 mg total) by mouth daily. Take first 2 tablets together, then 1 every day until finished. 6 tablet Billy Stabs FALCON, PA-C      PDMP not reviewed this encounter.   Billy Stabs FALCON, PA-C 03/21/23 1742

## 2023-03-21 NOTE — ED Triage Notes (Signed)
 This started on Monday, called my provider office and they were closed until Friday, I called them today and they declined seeing me and said go to Urgent Care and have a Flu Test. This whole week having Cough, Congestion, Body aches/sore, having right rib pain from coughing so much, sore throat at times and ha. No sob. No wheezing. I have had the flu vaccine, covid vaccine, pna vaccine and any others recommended.

## 2023-03-26 ENCOUNTER — Emergency Department (HOSPITAL_BASED_OUTPATIENT_CLINIC_OR_DEPARTMENT_OTHER): Payer: Medicare Other

## 2023-03-26 ENCOUNTER — Encounter (HOSPITAL_BASED_OUTPATIENT_CLINIC_OR_DEPARTMENT_OTHER): Payer: Self-pay | Admitting: Emergency Medicine

## 2023-03-26 ENCOUNTER — Ambulatory Visit
Admission: EM | Admit: 2023-03-26 | Discharge: 2023-03-26 | Disposition: A | Discharge status: Home / Self Care | Payer: Medicare Other

## 2023-03-26 ENCOUNTER — Other Ambulatory Visit: Payer: Self-pay

## 2023-03-26 ENCOUNTER — Telehealth: Payer: Self-pay

## 2023-03-26 ENCOUNTER — Emergency Department (HOSPITAL_BASED_OUTPATIENT_CLINIC_OR_DEPARTMENT_OTHER)
Admission: EM | Admit: 2023-03-26 | Discharge: 2023-03-26 | Disposition: A | Discharge status: Home / Self Care | Payer: Medicare Other | Attending: Emergency Medicine | Admitting: Emergency Medicine

## 2023-03-26 DIAGNOSIS — R062 Wheezing: Secondary | ICD-10-CM | POA: Insufficient documentation

## 2023-03-26 DIAGNOSIS — M79672 Pain in left foot: Secondary | ICD-10-CM

## 2023-03-26 DIAGNOSIS — Z8546 Personal history of malignant neoplasm of prostate: Secondary | ICD-10-CM | POA: Insufficient documentation

## 2023-03-26 DIAGNOSIS — Z636 Dependent relative needing care at home: Secondary | ICD-10-CM

## 2023-03-26 DIAGNOSIS — J40 Bronchitis, not specified as acute or chronic: Secondary | ICD-10-CM | POA: Insufficient documentation

## 2023-03-26 DIAGNOSIS — Z20822 Contact with and (suspected) exposure to covid-19: Secondary | ICD-10-CM | POA: Insufficient documentation

## 2023-03-26 DIAGNOSIS — I509 Heart failure, unspecified: Secondary | ICD-10-CM | POA: Insufficient documentation

## 2023-03-26 DIAGNOSIS — Z7982 Long term (current) use of aspirin: Secondary | ICD-10-CM | POA: Insufficient documentation

## 2023-03-26 DIAGNOSIS — R0602 Shortness of breath: Secondary | ICD-10-CM

## 2023-03-26 DIAGNOSIS — Z79899 Other long term (current) drug therapy: Secondary | ICD-10-CM | POA: Diagnosis not present

## 2023-03-26 DIAGNOSIS — N183 Chronic kidney disease, stage 3 unspecified: Secondary | ICD-10-CM | POA: Diagnosis not present

## 2023-03-26 DIAGNOSIS — R059 Cough, unspecified: Secondary | ICD-10-CM | POA: Diagnosis present

## 2023-03-26 DIAGNOSIS — M79671 Pain in right foot: Secondary | ICD-10-CM

## 2023-03-26 DIAGNOSIS — I130 Hypertensive heart and chronic kidney disease with heart failure and stage 1 through stage 4 chronic kidney disease, or unspecified chronic kidney disease: Secondary | ICD-10-CM | POA: Diagnosis not present

## 2023-03-26 LAB — RESP PANEL BY RT-PCR (RSV, FLU A&B, COVID)  RVPGX2
Influenza A by PCR: NEGATIVE
Influenza B by PCR: NEGATIVE
Resp Syncytial Virus by PCR: NEGATIVE
SARS Coronavirus 2 by RT PCR: NEGATIVE

## 2023-03-26 MED ORDER — BENZONATATE 100 MG PO CAPS
100.0000 mg | ORAL_CAPSULE | Freq: Once | ORAL | Status: AC
  Administered 2023-03-26: 100 mg via ORAL
  Filled 2023-03-26: qty 1

## 2023-03-26 MED ORDER — IPRATROPIUM-ALBUTEROL 0.5-2.5 (3) MG/3ML IN SOLN
3.0000 mL | Freq: Once | RESPIRATORY_TRACT | Status: AC
  Administered 2023-03-26: 3 mL via RESPIRATORY_TRACT
  Filled 2023-03-26: qty 3

## 2023-03-26 MED ORDER — BENZONATATE 100 MG PO CAPS: 100.0000 mg | ORAL_CAPSULE | Freq: Three times a day (TID) | ORAL | 0 refills | Status: DC

## 2023-03-26 NOTE — ED Provider Notes (Signed)
 [facility] Provider Note   CSN: [id number] Arrival date & time: 03/26/23  1632     History  Chief Complaint  Patient presents with   Cough    Colin Lewis is a 89 y.o. male history of CKD stage III, CHF, prostate cancer, hypertension presented with persistent nonproductive cough.  Patient was diagnosed with right middle lobe pneumonia on the third and took the Augmentin  and azithromycin  with no issue and states he has not had any fevers and is able to eat and drink that issue but notes he has a persisted nonproductive cough.  Patient denies leg swelling, chest pain, shortness of breath.  Home Medications Prior to Admission medications   Medication Sig Start Date End Date Taking? Authorizing Provider  benzonatate  (TESSALON ) 100 MG capsule Take 1 capsule (100 mg total) by mouth every 8 (eight) hours. 03/26/23  Yes Decker Cogdell, Colin DASEN, PA-C  albuterol  (VENTOLIN  HFA) 108 (90 Base) MCG/ACT inhaler Inhale into the lungs every 6 (six) hours as needed for wheezing or shortness of breath.    [provider]  amoxicillin -clavulanate (AUGMENTIN ) 875-125 MG tablet Take 1 tablet by mouth every 12 (twelve) hours. 03/21/23   Billy Asberry FALCON, PA-C  aspirin  EC 81 MG tablet Take 81 mg by mouth daily. Swallow whole.    [provider]  azithromycin  (ZITHROMAX ) 250 MG tablet Take 1 tablet (250 mg total) by mouth daily. Take first 2 tablets together, then 1 every day until finished. Patient taking differently: Take 250 mg by mouth daily. Take first 2 tablets together, then 1 every day until finished. Finished with now. 03/21/23   Billy Asberry FALCON, PA-C  cholecalciferol (VITAMIN D3) 25 MCG (1000 UNIT) tablet Take 1,000 Units by mouth daily.    [provider]  clotrimazole-betamethasone (LOTRISONE) cream SMARTSIG:0.5 Inch(es) Topical Twice Daily 09/05/21   [provider]  divalproex  (DEPAKOTE ) 250 MG DR tablet Take 1 tablet (250 mg total)  by mouth 2 (two) times daily. 05/07/22   Penumalli, Vikram R, MD  finasteride  (PROSCAR ) 5 MG tablet Take 1 tablet by mouth daily. 05/25/20   [provider]  guaiFENesin (MUCINEX PO) Take by mouth.    [provider]  Magnesium 400 MG TABS Take 400 mg by mouth daily.    [provider]  nystatin-triamcinolone ointment (MYCOLOG) Apply topically 2 (two) times daily as needed. 02/26/23   [provider]  simvastatin  (ZOCOR ) 20 MG tablet Take 1 tablet by mouth daily.    [provider]  telmisartan (MICARDIS) 40 MG tablet Take 40 mg by mouth daily. 09/29/19   [provider]      Allergies    Patient has no known allergies.    Review of Systems   Review of Systems  Respiratory:  Positive for cough.     Physical Exam Updated Vital Signs BP (!) 145/68   Pulse (!) 105   Temp 97.6 F (36.4 C) (Oral)   Resp 14   Wt 73 kg   SpO2 100% Comment: Patient ambulated around the department while on pulse ox.  Patient SPO2 remained at 100% and HR did not exceed 105.  Patient stated he was not short of breath while ambulating and is ready to go home.  BMI 26.78 kg/m  Physical Exam Vitals reviewed.  Constitutional:      General: He is not in acute distress. HENT:     Head: Normocephalic and atraumatic.  Eyes:  Extraocular Movements: Extraocular movements intact.     Conjunctiva/sclera: Conjunctivae normal.     Pupils: Pupils are equal, round, and reactive to light.  Cardiovascular:     Rate and Rhythm: Normal rate and regular rhythm.     Pulses: Normal pulses.     Heart sounds: Normal heart sounds.     Comments: 2+ bilateral radial/dorsalis pedis pulses with regular rate Pulmonary:     Effort: Pulmonary effort is normal. No respiratory distress.     Breath sounds: Normal breath sounds.  Abdominal:     Palpations: Abdomen is soft.     Tenderness: There is no abdominal tenderness. There is no guarding or rebound.  Musculoskeletal:         General: Normal range of motion.     Cervical back: Normal range of motion and neck supple.     Right lower leg: No edema.     Left lower leg: No edema.     Comments: 5 out of 5 bilateral grip/leg extension strength  Skin:    General: Skin is warm and dry.     Capillary Refill: Capillary refill takes less than 2 seconds.  Neurological:     General: No focal deficit present.     Mental Status: He is alert and oriented to person, place, and time.     Comments: Sensation intact in all 4 limbs  Psychiatric:        Mood and Affect: Mood normal.     ED Results / Procedures / Treatments   Labs (all labs ordered are listed, but only abnormal results are displayed) Labs Reviewed  RESP PANEL BY RT-PCR (RSV, FLU A&B, COVID)  [id number]    EKG None  Radiology DG Chest 2 View Result Date: 03/26/2023 CLINICAL DATA:  Cough EXAM: CHEST - 2 VIEW COMPARISON:  03/21/2023 FINDINGS: Stable cardiomediastinal silhouette. Aortic atherosclerotic calcification. Chronic bronchitic changes. No focal consolidation, pleural effusion, or pneumothorax. No displaced rib fractures. IMPRESSION: No active cardiopulmonary disease. Electronically Signed   By: Norman Gatlin M.D.   On: 03/26/2023 19:05    Procedures Procedures    Medications Ordered in ED Medications  ipratropium-albuterol  (DUONEB) 0.5-2.5 (3) MG/3ML nebulizer solution 3 mL (3 mLs Nebulization Given 03/26/23 1838)  benzonatate  (TESSALON ) capsule 100 mg (100 mg Oral Given 03/26/23 1917)    ED Course/ Medical Decision Making/ A&P                                 Medical Decision Making Amount and/or Complexity of Data Reviewed Radiology: ordered.  Risk Prescription drug management.   Colin Lewis 89 y.o. presented today for URI like symptoms. Working DDx that I considered at this time includes, but not limited to, viral illness, pharyngitis, mono, sinusitis, electrolyte abnormality, AOM, pneumonia.  R/o DDx: viral illness, pharyngitis, mono,  sinusitis, electrolyte abnormality, AOM, pneumonia: these diagnoses are not consistent with patient's history, presentation, physical exam, labs/imaging findings.  Review of prior external notes: 03/21/2023 ED  Unique Tests and My Interpretation:  Respiratory Panel: Negative Chest x-Nettle: No acute findings.  Social Determinants of Health: none  Discussion with Independent Historian:  Wife  Discussion of Management of Tests: None  Risk: Medium: prescription drug management  Risk Stratification Score: None  Staffed with Freddi, MD  Plan: On exam patient was in no acute distress stable vitals.  Patient's physical was remarkable for wheezing bilaterally.  Respiratory panel and chest x-Loyer were ordered  from triage.  Will give breathing treatment and reassess.  Patient was began Tessalon  for his cough.  Will also ambulate the patient as well.  Patient was ambulated did not become hypoxic.  Patient had stable vitals throughout his ED stay along with negative imaging and labs.  Do feel this is bronchitis in nature and after discussion with the attending we both agree that patient stable to be discharged.  Encourage patient to use Tylenol  every 6 hours needed for pain remain hydrated and to follow-up with his primary care provider.  Will prescribe the Tessalon  for the patient as patient did improve with the Tessalon  here.  Patient was given return precautions.patient stable for discharge at this time.  Patient verbalized understanding of plan.  This chart was dictated using voice recognition software.  Despite best efforts to proofread,  errors can occur which can change the documentation meaning.         Final Clinical Impression(s) / ED Diagnoses Final diagnoses:  Bronchitis    Rx / DC Orders ED Discharge Orders          Ordered    benzonatate  (TESSALON ) 100 MG capsule  Every 8 hours        03/26/23 2028              Colin Lewis, [place], PA-C 03/26/23 2030     Freddi Hamilton, MD 03/26/23 [phone]

## 2023-03-26 NOTE — ED Provider Notes (Signed)
 Patient presents to urgent care with worsening symptoms despite oral antibiotic therapy. Given age and risk factors recommended further evaluation in the ED.    Tomi Bamberger, PA-C 03/26/23 [phone]

## 2023-03-26 NOTE — ED Triage Notes (Signed)
 I think I am getting worse and not better. I had to take 2 pain pills to get out of the bed this morning. Still on 1 medication, finished the other. Current symptoms I feel like something is wrong with my feet they hurt a lot. I also have diarrhea/loose stools. No nausea or vomiting. Still coughing and not getting much up. No fever (known).

## 2023-03-26 NOTE — Discharge Instructions (Addendum)
 Please follow-up with your primary care provider in regards to recent symptoms and ER visit.  Today your labs and imaging are reassuring most likely have bronchitis from the pneumonia.  Please use the Tessalon  I have prescribed for you and drink plenty of fluids any food as tolerated use Tylenol  every 6 hours as needed for pain.  If symptoms change or worsen please return to the ER.

## 2023-03-26 NOTE — ED Notes (Signed)
 Patient is being discharged from the Urgent Care and sent to the Emergency Department via Self (Private Vehicle) . Per R. Billy RIGGERS, patient is in need of higher level of care due to Pna. Patient is aware and verbalizes understanding of plan of care.  Vitals:   03/26/23 1434  BP: 137/73  Pulse: 69  Resp: 20  Temp: 97.8 F (36.6 C)  SpO2: 97%

## 2023-03-26 NOTE — Telephone Encounter (Signed)
 Internal Referral, See SDOH information.  Discussed with patient.  Yancey Flemings CMA

## 2023-03-26 NOTE — ED Triage Notes (Signed)
 Persistent cough since 03/21/2023, was seen and treated with antibiotics. Sore ribs from coughing he said .

## 2023-04-29 ENCOUNTER — Encounter: Payer: Medicare Other | Admitting: Podiatry

## 2023-04-29 NOTE — Progress Notes (Signed)
Patient did not show for his scheduled appointment this morning

## 2023-05-05 ENCOUNTER — Other Ambulatory Visit: Payer: Self-pay | Admitting: Internal Medicine

## 2023-05-05 ENCOUNTER — Ambulatory Visit
Admission: RE | Admit: 2023-05-05 | Discharge: 2023-05-05 | Disposition: A | Discharge status: Home / Self Care | Payer: Medicare Other | Source: Ambulatory Visit | Attending: Internal Medicine | Admitting: Internal Medicine

## 2023-05-05 DIAGNOSIS — Z8701 Personal history of pneumonia (recurrent): Secondary | ICD-10-CM

## 2023-05-12 NOTE — Progress Notes (Unsigned)
 Patient: GAYLE COLLARD Date of Birth: [birth year]  Reason for Visit: Follow up History from: Patient Primary Neurologist: [facility]  ASSESSMENT AND PLAN 89 y.o. year old male TRANSIENT APHASIA (Mar 12, 2020 --> ddx: TIA vs partial seizure) - (was started on depakote 250mg  three times a day when hospitalized; patient reduced to 250mg  twice a day on his own; no further spells since Dec 2021)  -Doing great on Depakote, no recurrent seizure -Continue Depakote DR 250 mg twice daily, tolerates well  -Will send letter to [facility], that he has been very stable -Continue aspirin 81 mg daily for secondary stroke prevention, Strict management of vascular risk factors with a goal BP less than 130/90, A1c less than 7.0, LDL less than 70 for secondary stroke prevention -Follow-up in 1 year  HISTORY OF PRESENT ILLNESS: Today 05/13/23 Has DMV paperwork to be completed (gave big packets to PCP who turned in but needs letter from [place]). Remains on Depakote 250 mg BID. Is compliant with medication. No further spells. Tolerates well. He is caregiver for his wife, who is my patient. Health has been good. Remains on aspirin 81 mg daily. PCP follows labs.   HISTORY  UPDATE (05/07/22, VRP): Since last visit, doing well. No further events or seizures. Tolerating meds.     UPDATE (05/07/21, VRP): Since last visit, doing well. No more spells or sz. Tolerating divalproex 250mg  twice a day.     PRIOR HPI (05/03/20): 89 year old male here for evaluation of transient aphasia.  03/12/2020 patient was at home when he was noted to have difficulty getting his words out.  Patient went to the hospital for evaluation.  Symptoms lasted almost 24 hours.  Stroke and seizure work-up were obtained.  MRI was negative for acute stroke.  EEG showed some slowing over left temporal region.  Patient was empirically started on antiseizure medication.   Since that time patient is doing well.  No further events or spells.  He is on aspirin, statin,  blood pressure control for stroke prevention.  He is on Depakote 250 mg twice a day (he reduced to twice a day on his own).  REVIEW OF SYSTEMS: Out of a complete 14 system review of symptoms, the patient complains only of the following symptoms, and all other reviewed systems are negative.  See HPI  ALLERGIES: No Known Allergies  HOME MEDICATIONS: Outpatient Medications Prior to Visit  Medication Sig Dispense Refill   albuterol (VENTOLIN HFA) 108 (90 Base) MCG/ACT inhaler Inhale into the lungs every 6 (six) hours as needed for wheezing or shortness of breath.     aspirin EC 81 MG tablet Take 81 mg by mouth daily. Swallow whole.     cholecalciferol (VITAMIN D3) 25 MCG (1000 UNIT) tablet Take 1,000 Units by mouth daily.     finasteride (PROSCAR) 5 MG tablet Take 1 tablet by mouth daily.     Magnesium 400 MG TABS Take 400 mg by mouth daily.     nystatin-triamcinolone ointment (MYCOLOG) Apply topically 2 (two) times daily as needed.     simvastatin (ZOCOR) 20 MG tablet Take 1 tablet by mouth daily.     telmisartan (MICARDIS) 40 MG tablet Take 40 mg by mouth daily.     divalproex (DEPAKOTE) 250 MG DR tablet Take 1 tablet (250 mg total) by mouth 2 (two) times daily. 180 tablet 4   amoxicillin-clavulanate (AUGMENTIN) 875-125 MG tablet Take 1 tablet by mouth every 12 (twelve) hours. (Patient not taking: Reported on 05/13/2023) 14 tablet 0  azithromycin (ZITHROMAX) 250 MG tablet Take 1 tablet (250 mg total) by mouth daily. Take first 2 tablets together, then 1 every day until finished. (Patient not taking: Reported on 05/13/2023) 6 tablet 0   benzonatate (TESSALON) 100 MG capsule Take 1 capsule (100 mg total) by mouth every 8 (eight) hours. (Patient not taking: Reported on 05/13/2023) 21 capsule 0   clotrimazole-betamethasone (LOTRISONE) cream SMARTSIG:0.5 Inch(es) Topical Twice Daily (Patient not taking: Reported on 05/13/2023)     guaiFENesin (MUCINEX PO) Take by mouth. (Patient not taking: Reported  on 05/13/2023)     No facility-administered medications prior to visit.    PAST MEDICAL HISTORY: 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   Prostate cancer (HCC)    Skin cancer    ear   Thoracic spondylosis    TIA (transient ischemic attack) 02/2020   Wears glasses     PAST SURGICAL HISTORY: Past Surgical History:  Procedure Laterality Date   CARPAL TUNNEL RELEASE Left 10/23/2020   Procedure: LEFT CARPAL TUNNEL RELEASE;  Surgeon: Betha Loa, MD;  Location: [facility];  Service: Orthopedics;  Laterality: Left;  Bier block   CATARACT EXTRACTION W/ INTRAOCULAR LENS  IMPLANT, BILATERAL  2013   CRYOABLATION N/A 09/13/2013   Procedure: CRYO ABLATION PROSTATE;  Surgeon: Kathi Ludwig, MD;  Location: [facility];  Service: Urology;  Laterality: N/A;   CYSTO/  RIGHT RETROGRADE PYELOGRAM/  RIGHT URETERAL STENT PLACEMENT  01/18/2001   CYSTO/ RIGHT RETROGRADE PYELOGRAM/ RIGHT URETEROSCOPIC LASER LITHOTRIPSY STONE EXTRACTION / STENT PLACEMENT  10/28/2007   DILATATION MEATAL STENOSIS/  CYSTOSCOPY/  LEFT RETROGRADE PYELOGRAM/ LEFT URETEROSOPIC LASER LITHOTRIPSY STONE EXTRACTION / STENT PLACEMENT  10/04/1999   EXTRACORPOREAL SHOCK WAVE LITHOTRIPSY Left 05/18/2012   LEFT URETEROSCOPIC STONE EXTRACTION  10-13-2001 &  04-10-2007   MOHS SURGERY Left 02/16/2020   SKIN CANCER EXCISION  2024    FAMILY HISTORY: Family History  Problem Relation Age of Onset   Kidney failure Mother    Other Father        appendix infection   Throat cancer Sister     SOCIAL HISTORY: Social History   Socioeconomic History   Marital status: Married    Spouse name: Malachi Bonds   Number of children: 3   Years of education: Not on file   Highest education level: Some  college, no degree  Occupational History   Not on file  Tobacco Use   Smoking status: Never    Passive exposure: Never   Smokeless tobacco: Never  Vaping Use   Vaping status: Never Used  Substance and Sexual Activity   Alcohol use: No   Drug use: No   Sexual activity: Not Currently  Other Topics Concern   Not on file  Social History Narrative   ** Merged History Encounter **       05/03/20 lives with wife   Social Drivers of Corporate investment banker Strain: Not on file  Food Insecurity: Not on file  Transportation Needs: Not on file  Physical Activity: Not on file  Stress: Not on file  Social Connections: Not on file  Intimate Partner Violence: Not on file   PHYSICAL EXAM  Vitals:   05/13/23 1456  BP: 135/78  Pulse: 92  Weight: 154 lb  6.4 oz (70 kg)  Height: 5\' 5"  (1.651 m)   Body mass index is 25.69 kg/m.  Generalized: Well developed, in no acute distress  Neurological examination  Mentation: Alert oriented to time, place, history taking. Follows all commands speech and language fluent Cranial nerve II-XII: Pupils were equal round reactive to light. Extraocular movements were full, visual field were full on confrontational test. Facial sensation and strength were normal. Head turning and shoulder shrug  were normal and symmetric. Motor: The motor testing reveals 5 over 5 strength of all 4 extremities. Good symmetric motor tone is noted throughout.  Sensory: Sensory testing is intact to soft touch on all 4 extremities. No evidence of extinction is noted.  Coordination: Cerebellar testing reveals good finger-nose-finger and heel-to-shin bilaterally.  Gait and station: Gait is normal. Reflexes: Deep tendon reflexes are symmetric and normal bilaterally.   DIAGNOSTIC DATA (LABS, IMAGING, TESTING) - I reviewed patient records, labs, notes, testing and imaging myself where available.  Lab Results  Component Value Date   WBC 6.4 03/13/2020   HGB 13.4 03/13/2020    HCT 38.9 (L) 03/13/2020   MCV 92.2 03/13/2020   PLT 200 03/13/2020      Component Value Date/Time   NA 139 03/13/2020 0418   K 3.5 03/13/2020 0418   CL 103 03/13/2020 0418   CO2 27 03/13/2020 0418   GLUCOSE 106 (H) 03/13/2020 0418   BUN 24 (H) 03/13/2020 0418   CREATININE 1.47 (H) 03/13/2020 0418   CALCIUM 10.3 03/13/2020 0418   PROT 6.3 (L) 03/13/2020 0418   ALBUMIN 3.4 (L) 03/13/2020 0418   AST 34 03/13/2020 0418   ALT 15 03/13/2020 0418   ALKPHOS 91 03/13/2020 0418   BILITOT 0.7 03/13/2020 0418   GFRNONAA 46 (L) 03/13/2020 0418   GFRAA (L) 10/28/2007 0920    40        The eGFR has been calculated using the MDRD equation. This calculation has not been validated in all clinical   No results found for: "CHOL", "HDL", "LDLCALC", "LDLDIRECT", "TRIG", "CHOLHDL" No results found for: "HGBA1C" No results found for: "VITAMINB12" No results found for: "TSH"  Margie Ege, AGNP-C, DNP 05/13/2023, 3:15 PM [facility] [address], [address] [place], [place] [address] [phone]

## 2023-05-13 ENCOUNTER — Ambulatory Visit (INDEPENDENT_AMBULATORY_CARE_PROVIDER_SITE_OTHER): Payer: Medicare Other | Admitting: Neurology

## 2023-05-13 ENCOUNTER — Encounter: Payer: Self-pay | Admitting: Neurology

## 2023-05-13 ENCOUNTER — Telehealth: Payer: Self-pay | Admitting: *Deleted

## 2023-05-13 VITALS — BP 135/78 | HR 92 | Ht 65.0 in | Wt 154.4 lb

## 2023-05-13 DIAGNOSIS — I10 Essential (primary) hypertension: Secondary | ICD-10-CM | POA: Diagnosis not present

## 2023-05-13 DIAGNOSIS — R4701 Aphasia: Secondary | ICD-10-CM | POA: Diagnosis not present

## 2023-05-13 MED ORDER — DIVALPROEX SODIUM 250 MG PO DR TAB: 250.0000 mg | DELAYED_RELEASE_TABLET | Freq: Two times a day (BID) | ORAL | 4 refills | Status: AC

## 2023-05-13 NOTE — Telephone Encounter (Signed)
 Letter or DMV for pt to drive.  Medical records to fax.

## 2023-05-13 NOTE — Patient Instructions (Signed)
 Great to see you today.  I will send a letter to the [facility].  Please continue Depakote.  Call for any concerning spells. Follow up in 1 year

## 2023-05-14 ENCOUNTER — Telehealth: Payer: Self-pay | Admitting: *Deleted

## 2023-05-14 NOTE — Telephone Encounter (Signed)
 Pt dmv letter faxed on 05/14/2023

## 2023-06-22 ENCOUNTER — Emergency Department (HOSPITAL_BASED_OUTPATIENT_CLINIC_OR_DEPARTMENT_OTHER)

## 2023-06-22 ENCOUNTER — Encounter (HOSPITAL_BASED_OUTPATIENT_CLINIC_OR_DEPARTMENT_OTHER): Payer: Self-pay

## 2023-06-22 ENCOUNTER — Emergency Department (HOSPITAL_BASED_OUTPATIENT_CLINIC_OR_DEPARTMENT_OTHER)
Admission: EM | Admit: 2023-06-22 | Discharge: 2023-06-22 | Disposition: A | Discharge status: Home / Self Care | Attending: Emergency Medicine | Admitting: Emergency Medicine

## 2023-06-22 DIAGNOSIS — I10 Essential (primary) hypertension: Secondary | ICD-10-CM | POA: Diagnosis not present

## 2023-06-22 DIAGNOSIS — Z79899 Other long term (current) drug therapy: Secondary | ICD-10-CM | POA: Insufficient documentation

## 2023-06-22 DIAGNOSIS — Z8673 Personal history of transient ischemic attack (TIA), and cerebral infarction without residual deficits: Secondary | ICD-10-CM | POA: Diagnosis not present

## 2023-06-22 DIAGNOSIS — R7989 Other specified abnormal findings of blood chemistry: Secondary | ICD-10-CM | POA: Diagnosis not present

## 2023-06-22 DIAGNOSIS — R42 Dizziness and giddiness: Secondary | ICD-10-CM | POA: Diagnosis present

## 2023-06-22 DIAGNOSIS — Z7982 Long term (current) use of aspirin: Secondary | ICD-10-CM | POA: Diagnosis not present

## 2023-06-22 HISTORY — DX: Dizziness and giddiness: R42

## 2023-06-22 LAB — URINALYSIS, ROUTINE W REFLEX MICROSCOPIC
Bilirubin Urine: NEGATIVE
Glucose, UA: NEGATIVE mg/dL
Hgb urine dipstick: NEGATIVE
Ketones, ur: NEGATIVE mg/dL
Leukocytes,Ua: NEGATIVE
Nitrite: NEGATIVE
Protein, ur: NEGATIVE mg/dL
Specific Gravity, Urine: 1.025 (ref 1.005–1.030)
pH: 6 (ref 5.0–8.0)

## 2023-06-22 LAB — CBC
HCT: 36.6 % — ABNORMAL LOW (ref 39.0–52.0)
Hemoglobin: 12 g/dL — ABNORMAL LOW (ref 13.0–17.0)
MCH: 33.4 pg (ref 26.0–34.0)
MCHC: 32.8 g/dL (ref 30.0–36.0)
MCV: 101.9 fL — ABNORMAL HIGH (ref 80.0–100.0)
Platelets: 164 10*3/uL (ref 150–400)
RBC: 3.59 MIL/uL — ABNORMAL LOW (ref 4.22–5.81)
RDW: 15 % (ref 11.5–15.5)
WBC: 4.3 10*3/uL (ref 4.0–10.5)
nRBC: 0 % (ref 0.0–0.2)

## 2023-06-22 LAB — VALPROIC ACID LEVEL: Valproic Acid Lvl: 28 ug/mL — ABNORMAL LOW (ref 50.0–100.0)

## 2023-06-22 LAB — BASIC METABOLIC PANEL WITH GFR
Anion gap: 8 (ref 5–15)
BUN: 23 mg/dL (ref 8–23)
CO2: 26 mmol/L (ref 22–32)
Calcium: 9.3 mg/dL (ref 8.9–10.3)
Chloride: 105 mmol/L (ref 98–111)
Creatinine, Ser: 1.46 mg/dL — ABNORMAL HIGH (ref 0.61–1.24)
GFR, Estimated: 46 mL/min — ABNORMAL LOW (ref >60)
Glucose, Bld: 106 mg/dL — ABNORMAL HIGH (ref 70–99)
Potassium: 3.5 mmol/L (ref 3.5–5.1)
Sodium: 139 mmol/L (ref 135–145)

## 2023-06-22 MED ORDER — MECLIZINE HCL 25 MG PO TABS
25.0000 mg | ORAL_TABLET | Freq: Once | ORAL | Status: AC
  Administered 2023-06-22: 25 mg via ORAL
  Filled 2023-06-22: qty 1

## 2023-06-22 MED ORDER — SODIUM CHLORIDE 0.9 % IV BOLUS
500.0000 mL | Freq: Once | INTRAVENOUS | Status: AC
  Administered 2023-06-22: 500 mL via INTRAVENOUS

## 2023-06-22 MED ORDER — MECLIZINE HCL 12.5 MG PO TABS: 12.5000 mg | ORAL_TABLET | Freq: Three times a day (TID) | ORAL | 0 refills | Status: AC | PRN

## 2023-06-22 MED ORDER — DIAZEPAM 2 MG PO TABS: 2.0000 mg | ORAL_TABLET | Freq: Once | ORAL | Status: DC

## 2023-06-22 NOTE — ED Provider Notes (Addendum)
  Physical Exam  BP (!) 152/73   Pulse 69   Temp 98 F (36.7 C)   Resp 17   Wt 70.3 kg   SpO2 98%   BMI 25.79 kg/m   Physical Exam  Procedures  Procedures  ED Course / MDM    Medical Decision Making Amount and/or Complexity of Data Reviewed Labs: ordered. Radiology: ordered.  Risk Prescription drug management.   Central versus peripheral vertigo.  Received in signout and reassuring.  No acute stroke seen.  However patient unable to walk.  With the inability will admit to hospitalist for further workup.  States he is feeling better.  Does not want to be admitted now.  Had previously discussed with hospitalist who accepted.  However states he is feels if he is walking better.  He is not on be admitted to the hospital.  Will discharge with outpatient follow-up.  Will give small dose of Antivert to take as needed.  Also noticed Depakote level had not been checked.  Had added on but has not resulted at the time of him leaving.       Benjiman Core, MD 06/22/23 Juleen China, MD 06/22/23 2103

## 2023-06-22 NOTE — ED Notes (Signed)
 Orthostatic vitals completed per order. PT needed to be held up to do vitals standing and was swaying back and forth. Attempted to walk patient in hall per order, pt very unsteady on feet and assisted back to wheelchair with RN and tech help. Pt assisted back to bed back on monitor with call bell in reach.

## 2023-06-22 NOTE — ED Provider Notes (Signed)
 [facility] AT [facility] Provider Note   CSN: [id number] Arrival date & time: 06/22/23  1300     History  Chief Complaint  Patient presents with   Dizziness    Colin Lewis is a 89 y.o. male.  Patient is a 89 year old male with a history of hypertension, hyperlipidemia, TIA, seizures who presents with dizziness.  He says he has had dizziness for the last 2 to 3 days.  He says he was working outside when it started.  He has a sensation that the room is spinning but he also says it is worse when he stands up and feels a little lightheaded.  It does not seem to worsen if he is laying down or moving his head from side-to-side.  It is worse if he tries to bend over.  He feels unsteady on his feet and feels like his balance is off.  He denies any vision changes or speech deficits although his family thought that his speech is a little bit slurred.  He says it feels like his legs are not quite working right although difficult to ascertain whether this is general weakness.  He denies any increased numbness in his hands or his feet.  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  He has had vertigo once before but it was about 30 years ago and says that it did not feel like this.  He says the vertigo is a much more intense spinning sensation.  He does not have any associated nausea or vomiting.  No fevers.  No cough or cold symptoms.  No other recent illnesses.  No vomiting or diarrhea.  He does say that he has had some difficulty with urination over the last few days.  He said it started when he took a Sudafed.  He reports that he peed on himself as he was trying to go the bathroom.  He did have a sensation that he had to urinate but was unable to get to the bathroom in time.  He says he has been urinating normally today.       Home Medications Prior to Admission medications   Medication Sig Start Date End Date Taking? Authorizing Provider  albuterol (VENTOLIN  HFA) 108 (90 Base) MCG/ACT inhaler Inhale into the lungs every 6 (six) hours as needed for wheezing or shortness of breath.    [provider]  amoxicillin-clavulanate (AUGMENTIN) 875-125 MG tablet Take 1 tablet by mouth every 12 (twelve) hours. Patient not taking: Reported on 05/13/2023 03/21/23   Tomi Bamberger, PA-C  aspirin EC 81 MG tablet Take 81 mg by mouth daily. Swallow whole.    [provider]  azithromycin (ZITHROMAX) 250 MG tablet Take 1 tablet (250 mg total) by mouth daily. Take first 2 tablets together, then 1 every day until finished. Patient not taking: Reported on 05/13/2023 03/21/23   Tomi Bamberger, PA-C  benzonatate (TESSALON) 100 MG capsule Take 1 capsule (100 mg total) by mouth every 8 (eight) hours. Patient not taking: Reported on 05/13/2023 03/26/23   Netta Corrigan, PA-C  cholecalciferol (VITAMIN D3) 25 MCG (1000 UNIT) tablet Take 1,000 Units by mouth daily.    [provider]  clotrimazole-betamethasone (LOTRISONE) cream SMARTSIG:0.5 Inch(es) Topical Twice Daily Patient not taking: Reported on 05/13/2023 09/05/21   [provider]  divalproex (DEPAKOTE) 250 MG DR tablet Take 1 tablet (250 mg total) by mouth 2 (two) times daily. 05/13/23   Glean Salvo, NP  finasteride (PROSCAR) 5 MG tablet Take 1 tablet by mouth daily. 05/25/20   [provider]  guaiFENesin (MUCINEX PO) Take by mouth. Patient not taking: Reported on 05/13/2023    [provider]  Magnesium 400 MG TABS Take 400 mg by mouth daily.    [provider]  nystatin-triamcinolone ointment (MYCOLOG) Apply topically 2 (two) times daily as needed. 02/26/23   [provider]  simvastatin (ZOCOR) 20 MG tablet Take 1 tablet by mouth daily.    [provider]  telmisartan (MICARDIS) 40 MG tablet Take 40 mg by mouth daily. 09/29/19   [provider]      Allergies    Patient has no known allergies.    Review of Systems   Review of  Systems  Constitutional:  Positive for fatigue. Negative for chills, diaphoresis and fever.  HENT:  Negative for congestion, rhinorrhea and sneezing.   Eyes: Negative.   Respiratory:  Negative for cough, chest tightness and shortness of breath.   Cardiovascular:  Negative for chest pain and leg swelling.  Gastrointestinal:  Negative for abdominal pain, blood in stool, diarrhea, nausea and vomiting.  Genitourinary:  Negative for difficulty urinating, flank pain, frequency and hematuria.  Musculoskeletal:  Negative for arthralgias and back pain.  Skin:  Negative for rash.  Neurological:  Positive for dizziness and weakness (Leg weakness). Negative for speech difficulty, numbness and headaches.    Physical Exam Updated Vital Signs BP (!) 148/64   Pulse 79   Temp (!) 97.3 F (36.3 C)   Resp 15   Wt 70.3 kg   SpO2 100%   BMI 25.79 kg/m  Physical Exam Constitutional:      Appearance: He is well-developed.  HENT:     Head: Normocephalic and atraumatic.  Eyes:     Pupils: Pupils are equal, round, and reactive to light.     Comments: Slight horizontal nystagmus when he looks to the right.  No vertical or rotational nystagmus.  Cardiovascular:     Rate and Rhythm: Normal rate and regular rhythm.     Heart sounds: Normal heart sounds.  Pulmonary:     Effort: Pulmonary effort is normal. No respiratory distress.     Breath sounds: Normal breath sounds. No wheezing or rales.  Chest:     Chest wall: No tenderness.  Abdominal:     General: Bowel sounds are normal.     Palpations: Abdomen is soft.     Tenderness: There is no abdominal tenderness. There is no guarding or rebound.  Musculoskeletal:        General: Normal range of motion.     Cervical back: Normal range of motion and neck supple.  Lymphadenopathy:     Cervical: No cervical adenopathy.  Skin:    General: Skin is warm and dry.     Findings: No rash.  Neurological:     Mental Status: He is alert and oriented to person,  place, and time.     Comments: Motor 5 out of 5 all extremities, sensation grossly intact to light touch all extremities, cranial nerves II through XII grossly intact, finger-nose intact     ED Results / Procedures / Treatments   Labs (all labs ordered are listed, but only abnormal results are displayed) Labs Reviewed  BASIC METABOLIC PANEL WITH GFR - Abnormal; Notable for the following components:      Result Value   Glucose, Bld 106 (*)    Creatinine, Ser 1.46 (*)    GFR,  Estimated 46 (*)    All other components within normal limits  CBC - Abnormal; Notable for the following components:   RBC 3.59 (*)    Hemoglobin 12.0 (*)    HCT 36.6 (*)    MCV 101.9 (*)    All other components within normal limits  URINALYSIS, ROUTINE W REFLEX MICROSCOPIC    EKG EKG Interpretation Date/Time:  Sunday June 22 2023 13:17:52 EDT Ventricular Rate:  77 PR Interval:  190 QRS Duration:  104 QT Interval:  415 QTC Calculation: 470 R Axis:   -44  Text Interpretation: Sinus rhythm Left axis deviation Low voltage, precordial leads Minimal ST depression, lateral leads since last tracing no significant change Confirmed by Rolan Bucco [phone]) on 06/22/2023 1:21:50 PM  Radiology No results found.  Procedures Procedures    Medications Ordered in ED Medications  sodium chloride 0.9 % bolus 500 mL (500 mLs Intravenous New Bag/Given 06/22/23 1441)  meclizine (ANTIVERT) tablet 25 mg (25 mg Oral Given 06/22/23 1443)    ED Course/ Medical Decision Making/ A&P                                 Medical Decision Making Amount and/or Complexity of Data Reviewed Labs: ordered. Radiology: ordered.   Patient is a 89 year old who presents with dizziness.  It seems to be worse when he stands up but also when he tries to bend over.  He feels unsteady on his feet.  He does not have any other focal neurologic deficits.  He is having some urinary symptoms and then he sounds to have some degree of overflow  incontinence.  No fevers.  No other recent illnesses.  Labs are nonconcerning.  His creatinine is mildly elevated but similar to prior values based on chart review.  Awaiting urinalysis and MRI of his brain.  Was given some meclizine and IV fluids.  Care turned over to Dr. Rubin Payor awaiting these results.  Will need reassessment after MRI.  {Final Clinical Impression(s) / ED Diagnoses Final diagnoses:  Dizziness    Rx / DC Orders ED Discharge Orders     None         Rolan Bucco, MD 06/22/23 1511

## 2023-06-22 NOTE — ED Notes (Signed)
 ED tech attempted to walk patient. Pt unable to stand well and in danger of falling, placed in wheelchair and returned to bed.

## 2023-06-22 NOTE — Discharge Instructions (Addendum)
Try and keep yourself hydrated.  Follow-up with your doctor. 

## 2023-06-22 NOTE — ED Triage Notes (Addendum)
 Pt reports dizziness that 2 days. Pt reports only dizzy when he stands up. Decreased balance when standing and hands feel different.   Denies pain. Hx of vertigo 30 years ago

## 2023-06-24 ENCOUNTER — Other Ambulatory Visit: Payer: Self-pay

## 2023-06-24 ENCOUNTER — Emergency Department (HOSPITAL_COMMUNITY)

## 2023-06-24 ENCOUNTER — Observation Stay (HOSPITAL_COMMUNITY)
Admission: EM | Admit: 2023-06-24 | Discharge: 2023-06-26 | Disposition: A | Discharge status: Home / Self Care | Attending: Internal Medicine | Admitting: Internal Medicine

## 2023-06-24 ENCOUNTER — Encounter (HOSPITAL_COMMUNITY): Payer: Self-pay

## 2023-06-24 DIAGNOSIS — G40909 Epilepsy, unspecified, not intractable, without status epilepticus: Secondary | ICD-10-CM | POA: Diagnosis not present

## 2023-06-24 DIAGNOSIS — E785 Hyperlipidemia, unspecified: Secondary | ICD-10-CM | POA: Insufficient documentation

## 2023-06-24 DIAGNOSIS — J1282 Pneumonia due to coronavirus disease 2019: Secondary | ICD-10-CM | POA: Diagnosis not present

## 2023-06-24 DIAGNOSIS — I130 Hypertensive heart and chronic kidney disease with heart failure and stage 1 through stage 4 chronic kidney disease, or unspecified chronic kidney disease: Secondary | ICD-10-CM | POA: Diagnosis not present

## 2023-06-24 DIAGNOSIS — I5032 Chronic diastolic (congestive) heart failure: Secondary | ICD-10-CM | POA: Insufficient documentation

## 2023-06-24 DIAGNOSIS — D539 Nutritional anemia, unspecified: Secondary | ICD-10-CM | POA: Insufficient documentation

## 2023-06-24 DIAGNOSIS — M6281 Muscle weakness (generalized): Principal | ICD-10-CM | POA: Insufficient documentation

## 2023-06-24 DIAGNOSIS — Z7982 Long term (current) use of aspirin: Secondary | ICD-10-CM | POA: Insufficient documentation

## 2023-06-24 DIAGNOSIS — Z79899 Other long term (current) drug therapy: Secondary | ICD-10-CM | POA: Diagnosis not present

## 2023-06-24 DIAGNOSIS — I10 Essential (primary) hypertension: Secondary | ICD-10-CM | POA: Diagnosis present

## 2023-06-24 DIAGNOSIS — Z85828 Personal history of other malignant neoplasm of skin: Secondary | ICD-10-CM | POA: Diagnosis not present

## 2023-06-24 DIAGNOSIS — R531 Weakness: Principal | ICD-10-CM | POA: Insufficient documentation

## 2023-06-24 DIAGNOSIS — U071 COVID-19: Principal | ICD-10-CM | POA: Insufficient documentation

## 2023-06-24 DIAGNOSIS — N1831 Chronic kidney disease, stage 3a: Secondary | ICD-10-CM | POA: Insufficient documentation

## 2023-06-24 DIAGNOSIS — Z8546 Personal history of malignant neoplasm of prostate: Secondary | ICD-10-CM | POA: Insufficient documentation

## 2023-06-24 DIAGNOSIS — E538 Deficiency of other specified B group vitamins: Secondary | ICD-10-CM | POA: Insufficient documentation

## 2023-06-24 DIAGNOSIS — Z87898 Personal history of other specified conditions: Secondary | ICD-10-CM

## 2023-06-24 DIAGNOSIS — E782 Mixed hyperlipidemia: Secondary | ICD-10-CM

## 2023-06-24 LAB — CBC WITH DIFFERENTIAL/PLATELET
Abs Immature Granulocytes: 0.02 10*3/uL (ref 0.00–0.07)
Basophils Absolute: 0 10*3/uL (ref 0.0–0.1)
Basophils Relative: 1 %
Eosinophils Absolute: 0.3 10*3/uL (ref 0.0–0.5)
Eosinophils Relative: 6 %
HCT: 36.2 % — ABNORMAL LOW (ref 39.0–52.0)
Hemoglobin: 11.6 g/dL — ABNORMAL LOW (ref 13.0–17.0)
Immature Granulocytes: 0 %
Lymphocytes Relative: 24 %
Lymphs Abs: 1.3 10*3/uL (ref 0.7–4.0)
MCH: 33.3 pg (ref 26.0–34.0)
MCHC: 32 g/dL (ref 30.0–36.0)
MCV: 104 fL — ABNORMAL HIGH (ref 80.0–100.0)
Monocytes Absolute: 0.4 10*3/uL (ref 0.1–1.0)
Monocytes Relative: 7 %
Neutro Abs: 3.4 10*3/uL (ref 1.7–7.7)
Neutrophils Relative %: 62 %
Platelets: 152 10*3/uL (ref 150–400)
RBC: 3.48 MIL/uL — ABNORMAL LOW (ref 4.22–5.81)
RDW: 15 % (ref 11.5–15.5)
WBC: 5.4 10*3/uL (ref 4.0–10.5)
nRBC: 0 % (ref 0.0–0.2)

## 2023-06-24 LAB — TROPONIN I (HIGH SENSITIVITY)
Troponin I (High Sensitivity): 13 ng/L (ref <18)
Troponin I (High Sensitivity): 12 ng/L (ref <18)

## 2023-06-24 LAB — COMPREHENSIVE METABOLIC PANEL WITH GFR
ALT: 12 U/L (ref 0–44)
AST: 25 U/L (ref 15–41)
Albumin: 3.1 g/dL — ABNORMAL LOW (ref 3.5–5.0)
Alkaline Phosphatase: 60 U/L (ref 38–126)
Anion gap: 8 (ref 5–15)
BUN: 16 mg/dL (ref 8–23)
CO2: 27 mmol/L (ref 22–32)
Calcium: 9.2 mg/dL (ref 8.9–10.3)
Chloride: 105 mmol/L (ref 98–111)
Creatinine, Ser: 1.31 mg/dL — ABNORMAL HIGH (ref 0.61–1.24)
GFR, Estimated: 52 mL/min — ABNORMAL LOW (ref >60)
Glucose, Bld: 119 mg/dL — ABNORMAL HIGH (ref 70–99)
Potassium: 3.8 mmol/L (ref 3.5–5.1)
Sodium: 140 mmol/L (ref 135–145)
Total Bilirubin: 0.7 mg/dL (ref 0.0–1.2)
Total Protein: 6.1 g/dL — ABNORMAL LOW (ref 6.5–8.1)

## 2023-06-24 LAB — URINALYSIS, W/ REFLEX TO CULTURE (INFECTION SUSPECTED)
Bilirubin Urine: NEGATIVE
Glucose, UA: NEGATIVE mg/dL
Hgb urine dipstick: NEGATIVE
Ketones, ur: NEGATIVE mg/dL
Leukocytes,Ua: NEGATIVE
Nitrite: NEGATIVE
Protein, ur: NEGATIVE mg/dL
Specific Gravity, Urine: 1.01 (ref 1.005–1.030)
pH: 6 (ref 5.0–8.0)

## 2023-06-24 LAB — C-REACTIVE PROTEIN: CRP: 0.9 mg/dL (ref <1.0)

## 2023-06-24 LAB — BRAIN NATRIURETIC PEPTIDE: B Natriuretic Peptide: 148.7 pg/mL — ABNORMAL HIGH (ref 0.0–100.0)

## 2023-06-24 LAB — RESP PANEL BY RT-PCR (RSV, FLU A&B, COVID)  RVPGX2
Influenza A by PCR: NEGATIVE
Influenza B by PCR: NEGATIVE
Resp Syncytial Virus by PCR: NEGATIVE
SARS Coronavirus 2 by RT PCR: POSITIVE — AB

## 2023-06-24 LAB — PROCALCITONIN: Procalcitonin: <0.1 ng/mL

## 2023-06-24 LAB — AMMONIA: Ammonia: 15 umol/L (ref 9–35)

## 2023-06-24 LAB — SALICYLATE LEVEL: Salicylate Lvl: <7 mg/dL — ABNORMAL LOW (ref 7.0–30.0)

## 2023-06-24 LAB — ETHANOL: Alcohol, Ethyl (B): <10 mg/dL (ref <10)

## 2023-06-24 LAB — MAGNESIUM: Magnesium: 1.9 mg/dL (ref 1.7–2.4)

## 2023-06-24 LAB — ACETAMINOPHEN LEVEL: Acetaminophen (Tylenol), Serum: <10 ug/mL — ABNORMAL LOW (ref 10–30)

## 2023-06-24 MED ORDER — BENZONATATE 100 MG PO CAPS: 100.0000 mg | ORAL_CAPSULE | Freq: Three times a day (TID) | ORAL | Status: DC | PRN

## 2023-06-24 MED ORDER — MELATONIN 3 MG PO TABS
3.0000 mg | ORAL_TABLET | Freq: Every evening | ORAL | Status: DC | PRN
  Administered 2023-06-26: 3 mg via ORAL
  Filled 2023-06-24: qty 1

## 2023-06-24 MED ORDER — ACETAMINOPHEN 325 MG PO TABS: 650.0000 mg | ORAL_TABLET | Freq: Four times a day (QID) | ORAL | Status: DC | PRN

## 2023-06-24 MED ORDER — ALBUTEROL SULFATE (2.5 MG/3ML) 0.083% IN NEBU: 2.5000 mg | INHALATION_SOLUTION | Freq: Four times a day (QID) | RESPIRATORY_TRACT | Status: DC | PRN

## 2023-06-24 MED ORDER — MECLIZINE HCL 25 MG PO TABS
25.0000 mg | ORAL_TABLET | Freq: Once | ORAL | Status: AC
  Administered 2023-06-24: 25 mg via ORAL
  Filled 2023-06-24: qty 1

## 2023-06-24 MED ORDER — ACETAMINOPHEN 650 MG RE SUPP: 650.0000 mg | Freq: Four times a day (QID) | RECTAL | Status: DC | PRN

## 2023-06-24 MED ORDER — ONDANSETRON HCL 4 MG/2ML IJ SOLN: 4.0000 mg | Freq: Four times a day (QID) | INTRAMUSCULAR | Status: DC | PRN

## 2023-06-24 NOTE — ED Provider Notes (Signed)
 [place] [facility] AT [facility] Provider Note   CSN: [id number] Arrival date & time: 06/24/23  1511     History  Chief Complaint  Patient presents with   Dizziness    Colin Lewis is a 89 y.o. male.  89 year old male returns today for concern of weakness, and altered mental status.  He was seen 4 days ago at [facility].  MRI and labs were done at that time without any acute findings.  He was offered admission due to inability to walk however he declined at the time.  He returns today for progressive worsening of his weakness and he developed confusion.  He states he was having difficulty putting the spoon in his mouth which is unusual for him.  He states he is requiring assistance to walk.  He does state that he fell today.  No loss of consciousness.  Denies any neck pain.  The history is provided by the patient. No language interpreter was used.       Home Medications Prior to Admission medications   Medication Sig Start Date End Date Taking? Authorizing Provider  albuterol (VENTOLIN HFA) 108 (90 Base) MCG/ACT inhaler Inhale into the lungs every 6 (six) hours as needed for wheezing or shortness of breath.    [provider]  amoxicillin-clavulanate (AUGMENTIN) 875-125 MG tablet Take 1 tablet by mouth every 12 (twelve) hours. Patient not taking: Reported on 05/13/2023 03/21/23   Tomi Bamberger, PA-C  aspirin EC 81 MG tablet Take 81 mg by mouth daily. Swallow whole.    [provider]  azithromycin (ZITHROMAX) 250 MG tablet Take 1 tablet (250 mg total) by mouth daily. Take first 2 tablets together, then 1 every day until finished. Patient not taking: Reported on 05/13/2023 03/21/23   Tomi Bamberger, PA-C  benzonatate (TESSALON) 100 MG capsule Take 1 capsule (100 mg total) by mouth every 8 (eight) hours. Patient not taking: Reported on 05/13/2023 03/26/23   Netta Corrigan, PA-C  cholecalciferol (VITAMIN D3) 25 MCG (1000 UNIT) tablet  Take 1,000 Units by mouth daily.    [provider]  clotrimazole-betamethasone (LOTRISONE) cream SMARTSIG:0.5 Inch(es) Topical Twice Daily Patient not taking: Reported on 05/13/2023 09/05/21   [provider]  divalproex (DEPAKOTE) 250 MG DR tablet Take 1 tablet (250 mg total) by mouth 2 (two) times daily. 05/13/23   Glean Salvo, NP  finasteride (PROSCAR) 5 MG tablet Take 1 tablet by mouth daily. 05/25/20   [provider]  guaiFENesin (MUCINEX PO) Take by mouth. Patient not taking: Reported on 05/13/2023    [provider]  Magnesium 400 MG TABS Take 400 mg by mouth daily.    [provider]  meclizine (ANTIVERT) 12.5 MG tablet Take 1 tablet (12.5 mg total) by mouth 3 (three) times daily as needed for dizziness. 06/22/23   Benjiman Core, MD  nystatin-triamcinolone ointment [facility]) Apply topically 2 (two) times daily as needed. 02/26/23   [provider]  simvastatin (ZOCOR) 20 MG tablet Take 1 tablet by mouth daily.    [provider]  telmisartan (MICARDIS) 40 MG tablet Take 40 mg by mouth daily. 09/29/19   [provider]      Allergies    Patient has no known allergies.    Review of Systems   Review of Systems  Constitutional:  Negative for chills and fever.  Respiratory:  Negative for shortness of breath.   Cardiovascular:  Negative for chest pain.  Neurological:  Positive for weakness. Negative for syncope and light-headedness.  All other systems reviewed and are negative.   Physical Exam Updated Vital Signs BP (!) 132/92   Pulse 80   Temp 97.6 F (36.4 C) (Oral)   Resp 18   SpO2 100%  Physical Exam  ED Results / Procedures / Treatments   Labs (all labs ordered are listed, but only abnormal results are displayed) Labs Reviewed  ACETAMINOPHEN LEVEL  CBC WITH DIFFERENTIAL/PLATELET  AMMONIA  COMPREHENSIVE METABOLIC PANEL WITH GFR  ETHANOL  MAGNESIUM  SALICYLATE LEVEL  URINALYSIS, W/ REFLEX TO  CULTURE (INFECTION SUSPECTED)    EKG None  Radiology MR BRAIN WO CONTRAST Result Date: 06/22/2023 CLINICAL DATA:  Neuro deficit, acute, stroke suspected. Dizziness. Bilateral leg weakness. Urinary incontinence. EXAM: MRI HEAD WITHOUT CONTRAST TECHNIQUE: Multiplanar, multiecho pulse sequences of the brain and surrounding structures were obtained without intravenous contrast. COMPARISON:  MR head without contrast. CT angio head and neck 03/13/2020. FINDINGS: Brain: No acute infarct, hemorrhage, or mass lesion is present. A remote white matter infarct in the right cerebellum is again noted. Mild periventricular and subcortical white matter changes are otherwise similar the prior study, likely within normal limits for age. The ventricles are of normal size. No significant extraaxial fluid collection is present. Deep brain nuclei are within normal limits. The brainstem and cerebellum are within normal limits. The internal auditory canals are within normal limits. Midline structures are within normal limits. Vascular: Flow is present in the major intracranial arteries. Skull and upper cervical spine: The craniocervical junction is normal. Upper cervical spine is within normal limits. Marrow signal is unremarkable. Sinuses/Orbits: Mild mucosal thickening is present in the maxillary sinuses and ethmoid air cells bilaterally. Mild mucosal thickening is present left sphenoid sinus. No fluid levels are present. Mastoid air cells are clear. Bilateral lens replacements are noted. Globes and orbits are otherwise unremarkable. IMPRESSION: 1. No acute intracranial abnormality or significant interval change. 2. 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 3. Mild periventricular and subcortical white matter changes are otherwise similar the prior study, likely within normal limits for age. 4. Mild paranasal sinus disease. Electronically Signed   By: Marin Roberts M.D.   On: 06/22/2023 16:56     Procedures Procedures    Medications Ordered in ED Medications - No data to display  ED Course/ Medical Decision Making/ A&P                                 Medical Decision Making Amount and/or Complexity of Data Reviewed Labs: ordered. Radiology: ordered.  Risk Decision regarding hospitalization.   Medical Decision Making / ED Course   This patient presents to the ED for concern of weakness, this involves an extensive number of treatment options, and is a complaint that carries with it a high risk of complications and morbidity.  The differential diagnosis includes viral URI, pneumonia, UTI, metabolic derangement, intracranial bleed, CVA  MDM: 89 year old male returns today for concern of weakness, and confusion episodes.  He was seen 4 days ago but discharged then despite being offered admission because he stated he wanted to go home.  Will obtain broad workup looking for infectious source.  Recently had an MRI which was negative for CVA.  CBC without leukocytosis, hemoglobin around patient's baseline.  Acetaminophen, salicylate, ethanol within normal.  Ammonia of 15.  CMP without acute concern.  Creatinine 1.31, glucose 119.  COVID-positive.  UA without evidence of UTI.  EKG without acute ischemic change.  CT head, CT cervical spine without acute finding.  Chest x-[place] without acute cardiopulmonary process.  Given progressive worsening in the setting of COVID will discuss with hospitalist for admission.  Patient is agreeable for admission today.  Discussed with hospitalist will evaluate patient for admission.   Additional history obtained: -Additional history obtained from recent ED visit, family at bedside -External records from outside source obtained and reviewed including: Chart review including previous notes, labs, imaging, consultation notes   Lab Tests: -I ordered, reviewed, and interpreted labs.   The pertinent results include:   Labs Reviewed   RESP PANEL BY RT-PCR (RSV, FLU A&B, COVID)  RVPGX2 - Abnormal; Notable for the following components:      Result Value   SARS Coronavirus 2 by RT PCR POSITIVE (*)    All other components within normal limits  ACETAMINOPHEN LEVEL - Abnormal; Notable for the following components:   Acetaminophen (Tylenol), Serum <10 (*)    All other components within normal limits  CBC WITH DIFFERENTIAL/PLATELET - Abnormal; Notable for the following components:   RBC 3.48 (*)    Hemoglobin 11.6 (*)    HCT 36.2 (*)    MCV 104.0 (*)    All other components within normal limits  COMPREHENSIVE METABOLIC PANEL WITH GFR - Abnormal; Notable for the following components:   Glucose, Bld 119 (*)    Creatinine, Ser 1.31 (*)    Total Protein 6.1 (*)    Albumin 3.1 (*)    GFR, Estimated 52 (*)    All other components within normal limits  SALICYLATE LEVEL - Abnormal; Notable for the following components:   Salicylate Lvl <7.0 (*)    All other components within normal limits  URINALYSIS, W/ REFLEX TO CULTURE (INFECTION SUSPECTED) - Abnormal; Notable for the following components:   Bacteria, UA RARE (*)    All other components within normal limits  BRAIN NATRIURETIC PEPTIDE - Abnormal; Notable for the following components:   B Natriuretic Peptide 148.7 (*)    All other components within normal limits  AMMONIA  ETHANOL  MAGNESIUM  C-REACTIVE PROTEIN  CBC WITH DIFFERENTIAL/PLATELET  COMPREHENSIVE METABOLIC PANEL WITH GFR  MAGNESIUM  PHOSPHORUS  C-REACTIVE PROTEIN  PROCALCITONIN  TROPONIN I (HIGH SENSITIVITY)  TROPONIN I (HIGH SENSITIVITY)      EKG  EKG Interpretation Date/Time:  Tuesday June 24 2023 15:27:56 EDT Ventricular Rate:  78 PR Interval:  208 QRS Duration:  106 QT Interval:  419 QTC Calculation: 478 R Axis:   -47  Text Interpretation: Sinus rhythm Left axis deviation Low voltage, precordial leads Borderline prolonged QT interval No significant change since last tracing Confirmed by Richardean Canal [phone]) on 06/24/2023 3:45:43 PM         Imaging Studies ordered: I ordered imaging studies including chest x-[place], CT head, CT head I independently visualized and interpreted imaging. I agree with the radiologist interpretation   Medicines ordered and prescription drug management: Meds ordered this encounter  Medications   meclizine (ANTIVERT) tablet 25 mg   OR Linked Order Group    acetaminophen (TYLENOL) tablet 650 mg    acetaminophen (TYLENOL) suppository 650 mg   melatonin tablet 3 mg   ondansetron (ZOFRAN) injection 4 mg   albuterol (PROVENTIL) (2.5 MG/3ML) 0.083% nebulizer solution 2.5 mg   benzonatate (TESSALON) capsule 100 mg    -I have reviewed the patients home medicines and have made adjustments as needed  Reevaluation:  After the interventions noted above, I reevaluated the patient and found that they have :stayed the same  Co morbidities that complicate the patient evaluation  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   Final Clinical Impression(s) / ED Diagnoses Final diagnoses:  COVID-19  Weakness    Rx / DC Orders ED Discharge Orders     None         Marita Kansas, PA-C 06/24/23 2312    Charlynne Pander, MD 06/25/23 [phone]

## 2023-06-24 NOTE — H&P (Signed)
 History and Physical      SHAFTER JUPIN WUJ:[id number] DOB: 1932/10/01 DOA: 06/24/2023; DOS: 06/24/2023  PCP: Ralene Ok, MD *** Patient coming from: home ***  I have personally briefly reviewed patient's old medical records in [facility]  Chief Complaint: ***  HPI: Colin Lewis is a 89 y.o. male with medical history significant for *** who is admitted to [facility] on 06/24/2023 with *** after presenting from home*** to [facility] ED complaining of ***.    ***       ***   ED Course:  Vital signs in the ED were notable for the following: ***  Labs were notable for the following: ***  Per my interpretation, EKG in ED demonstrated the following:  ***  Imaging in the ED, per corresponding formal radiology read, was notable for the following:  ***  While in the ED, the following were administered: ***  Subsequently, the patient was admitted  ***  ***red    Review of Systems: As per HPI otherwise 10 point review of systems negative.   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   Prostate cancer (HCC)    Skin cancer    ear   Thoracic spondylosis    TIA (transient ischemic attack) 02/2020   Vertigo    Wears glasses     Past Surgical History:  Procedure Laterality Date   CARPAL TUNNEL RELEASE Left 10/23/2020   Procedure: LEFT CARPAL TUNNEL RELEASE;  Surgeon: Betha Loa, MD;  Location:  SURGERY CENTER;  Service: Orthopedics;  Laterality: Left;  Bier block   CATARACT EXTRACTION W/ INTRAOCULAR LENS  IMPLANT, BILATERAL  2013   CRYOABLATION N/A 09/13/2013   Procedure: CRYO ABLATION PROSTATE;  Surgeon: Kathi Ludwig, MD;  Location: [facility];  Service: Urology;  Laterality: N/A;   CYSTO/  RIGHT RETROGRADE PYELOGRAM/   RIGHT URETERAL STENT PLACEMENT  01/18/2001   CYSTO/ RIGHT RETROGRADE PYELOGRAM/ RIGHT URETEROSCOPIC LASER LITHOTRIPSY STONE EXTRACTION / STENT PLACEMENT  10/28/2007   DILATATION MEATAL STENOSIS/  CYSTOSCOPY/  LEFT RETROGRADE PYELOGRAM/ LEFT URETEROSOPIC LASER LITHOTRIPSY STONE EXTRACTION / STENT PLACEMENT  10/04/1999   EXTRACORPOREAL SHOCK WAVE LITHOTRIPSY Left 05/18/2012   LEFT URETEROSCOPIC STONE EXTRACTION  10-13-2001 &  04-10-2007   MOHS SURGERY Left 02/16/2020   SKIN CANCER EXCISION  2024    Social History:  reports that he has never smoked. He has never been exposed to tobacco smoke. He has never used smokeless tobacco. He reports that he does not drink alcohol and does not use drugs.   No Known Allergies  Family History  Problem Relation Age of Onset   Kidney failure Mother    Other Father        appendix infection   Throat cancer Sister     Family history reviewed and not pertinent ***   Prior to Admission medications   Medication Sig Start Date End Date Taking? Authorizing Provider  albuterol (VENTOLIN HFA) 108 (90 Base) MCG/ACT inhaler Inhale into the lungs every 6 (six) hours as needed for wheezing or shortness of breath.    [provider]  amoxicillin-clavulanate (AUGMENTIN) 875-125 MG tablet Take 1 tablet by mouth every 12 (twelve) hours. Patient not taking: Reported on 05/13/2023 03/21/23   Tomi Bamberger, PA-C  aspirin EC 81 MG tablet Take 81 mg by mouth daily. Swallow whole.    [provider]  azithromycin (ZITHROMAX) 250 MG tablet Take 1 tablet (250 mg total) by mouth daily. Take first 2 tablets together, then 1 every day until finished. Patient not taking: Reported on 05/13/2023 03/21/23   Tomi Bamberger, PA-C  benzonatate (TESSALON) 100 MG capsule Take 1 capsule (100 mg total) by mouth every 8 (eight) hours. Patient not taking: Reported on 05/13/2023 03/26/23   Netta Corrigan, PA-C  cholecalciferol (VITAMIN D3) 25 MCG (1000 UNIT) tablet Take  1,000 Units by mouth daily.    [provider]  clotrimazole-betamethasone (LOTRISONE) cream SMARTSIG:0.5 Inch(es) Topical Twice Daily Patient not taking: Reported on 05/13/2023 09/05/21   [provider]  divalproex (DEPAKOTE) 250 MG DR tablet Take 1 tablet (250 mg total) by mouth 2 (two) times daily. 05/13/23   Glean Salvo, NP  finasteride (PROSCAR) 5 MG tablet Take 1 tablet by mouth daily. 05/25/20   [provider]  guaiFENesin (MUCINEX PO) Take by mouth. Patient not taking: Reported on 05/13/2023    [provider]  Magnesium 400 MG TABS Take 400 mg by mouth daily.    [provider]  meclizine (ANTIVERT) 12.5 MG tablet Take 1 tablet (12.5 mg total) by mouth 3 (three) times daily as needed for dizziness. 06/22/23   Benjiman Core, MD  nystatin-triamcinolone ointment [facility]) Apply topically 2 (two) times daily as needed. 02/26/23   [provider]  simvastatin (ZOCOR) 20 MG tablet Take 1 tablet by mouth daily.    [provider]  telmisartan (MICARDIS) 40 MG tablet Take 40 mg by mouth daily. 09/29/19   [provider]     Objective    Physical Exam: Vitals:   06/24/23 1530 06/24/23 1535  BP: (!) 132/92   Pulse: 80   Resp: 18   Temp:  97.6 F (36.4 C)  TempSrc:  Oral  SpO2: 100%     General: appears to be stated age; alert, oriented Skin: warm, dry, no rash Head:  AT/[place] Mouth:  Oral mucosa membranes appear moist, normal dentition Neck: supple; trachea midline Heart:  RRR; did not appreciate any M/R/G Lungs: CTAB, did not appreciate any wheezes, rales, or rhonchi Abdomen: + BS; soft, ND, NT Vascular: 2+ pedal pulses b/l; 2+ radial pulses b/l Extremities: no peripheral edema, no muscle wasting Neuro: strength and sensation intact in upper and lower extremities b/l ***   *** Neuro: 5/5 strength of the proximal and distal flexors and extensors of the upper and lower extremities bilaterally; sensation  intact in upper and lower extremities b/l; cranial nerves II through XII grossly intact; no pronator drift; no evidence suggestive of slurred speech, dysarthria, or facial droop; Normal muscle tone. No tremors.  *** Neuro: In the setting of the patient's current mental status and associated inability to follow instructions, unable to perform full neurologic exam at this time.  As such, assessment of strength, sensation, and cranial nerves is limited at this time. Patient noted to spontaneously move all 4 extremities. No tremors.  ***    Labs on Admission: I have personally reviewed following labs and imaging studies  CBC: Recent Labs  Lab 06/22/23 1322 06/24/23 1551  WBC 4.3 5.4  NEUTROABS  --  3.4  HGB 12.0* 11.6*  HCT 36.6* 36.2*  MCV 101.9* 104.0*  PLT 164 152   Basic Metabolic Panel: Recent Labs  Lab 06/22/23 1322 06/24/23 1551  NA 139 140  K 3.5 3.8  CL 105 105  CO2 26 27  GLUCOSE 106* 119*  BUN 23 16  CREATININE 1.46* 1.31*  CALCIUM 9.3 9.2  MG  --  1.9   GFR: Estimated Creatinine Clearance: 33.9 mL/min (A) (by C-G formula based on SCr of 1.31 mg/dL (H)). Liver Function Tests: Recent Labs  Lab 06/24/23 1551  AST 25  ALT 12  ALKPHOS 60  BILITOT 0.7  PROT 6.1*  ALBUMIN 3.1*   No results for input(s): "LIPASE", "AMYLASE" in the last 168 hours. Recent Labs  Lab 06/24/23 1551  AMMONIA 15   Coagulation Profile: No results for input(s): "INR", "PROTIME" in the last 168 hours. Cardiac Enzymes: No results for input(s): "CKTOTAL", "CKMB", "CKMBINDEX", "TROPONINI" in the last 168 hours. BNP (last 3 results) No results for input(s): "PROBNP" in the last 8760 hours. HbA1C: No results for input(s): "HGBA1C" in the last 72 hours. CBG: No results for input(s): "GLUCAP" in the last 168 hours. Lipid Profile: No results for input(s): "CHOL", "HDL", "LDLCALC", "TRIG", "CHOLHDL", "LDLDIRECT" in the last 72 hours. Thyroid Function Tests: No results for input(s):  "TSH", "T4TOTAL", "FREET4", "T3FREE", "THYROIDAB" in the last 72 hours. Anemia Panel: No results for input(s): "VITAMINB12", "FOLATE", "FERRITIN", "TIBC", "IRON", "RETICCTPCT" in the last 72 hours. Urine analysis:    Component Value Date/Time   COLORURINE YELLOW 06/24/2023 1533   APPEARANCEUR CLEAR 06/24/2023 1533   LABSPEC 1.010 06/24/2023 1533   PHURINE 6.0 06/24/2023 1533   GLUCOSEU NEGATIVE 06/24/2023 1533   HGBUR NEGATIVE 06/24/2023 1533   BILIRUBINUR NEGATIVE 06/24/2023 1533   KETONESUR NEGATIVE 06/24/2023 1533   PROTEINUR NEGATIVE 06/24/2023 1533   UROBILINOGEN 1.0 04/10/2007 0630   NITRITE NEGATIVE 06/24/2023 1533   LEUKOCYTESUR NEGATIVE 06/24/2023 1533    Radiological Exams on Admission: CT HEAD WO CONTRAST ( ) Result Date: 06/24/2023 CLINICAL DATA:  Mental status change, dizziness, confusion. EXAM: CT HEAD WITHOUT CONTRAST CT CERVICAL SPINE WITHOUT CONTRAST TECHNIQUE: Multidetector CT imaging of the head and cervical spine was performed following the standard protocol without intravenous contrast. Multiplanar CT image reconstructions of the cervical spine were also generated. RADIATION DOSE REDUCTION: This exam was performed according to the departmental dose-optimization program which includes automated exposure control, adjustment of the mA and/or kV according to patient size and/or use of iterative reconstruction technique. COMPARISON:  MRI head 06/22/2023. FINDINGS: CT HEAD FINDINGS Brain: No acute intracranial hemorrhage. No CT evidence of acute infarct. No edema, mass effect, or midline shift. The basilar cisterns are patent. Ventricles: Prominence of the ventricles suggesting underlying parenchymal volume loss. Vascular: Atherosclerotic calcifications of the carotid siphons. No hyperdense vessel. Skull: No acute or aggressive finding. Orbits: Orbits are symmetric. Sinuses: Mild mucosal thickening in the ethmoid and maxillary sinuses. Additional mucosal thickening in the  anterior aspect of the sphenoid sinuses. Other: Mastoid air cells are clear. CT CERVICAL SPINE FINDINGS Alignment: Cervical lordosis is maintained. No listhesis. No facet subluxation or dislocation. Skull base and vertebrae: No compression fracture or displaced fracture in the cervical spine. Partial fusion of the C4 and C5 vertebral bodies. Soft tissues and spinal canal: No prevertebral fluid or swelling. No visible canal hematoma. Disc levels: Intervertebral disc space narrowing at multiple levels. Uncovertebral hypertrophy throughout the cervical spine. Disc osteophyte complexes at C3-4 through C6-7 without high-grade osseous spinal canal stenosis. There is foraminal narrowing at multiple levels, greatest and severe on the right at C3-4 and bilaterally at C5-6 and C6-7. Upper chest: Negative. Other: None. IMPRESSION: No CT evidence of acute intracranial  abnormality. No acute fracture or traumatic malalignment of the cervical spine. Degenerative changes of the cervical spine as above. Electronically Signed   By: Emily Filbert M.D.   On: 06/24/2023 20:07   CT Cervical Spine Wo Contrast Result Date: 06/24/2023 CLINICAL DATA:  Mental status change, dizziness, confusion. EXAM: CT HEAD WITHOUT CONTRAST CT CERVICAL SPINE WITHOUT CONTRAST TECHNIQUE: Multidetector CT imaging of the head and cervical spine was performed following the standard protocol without intravenous contrast. Multiplanar CT image reconstructions of the cervical spine were also generated. RADIATION DOSE REDUCTION: This exam was performed according to the departmental dose-optimization program which includes automated exposure control, adjustment of the mA and/or kV according to patient size and/or use of iterative reconstruction technique. COMPARISON:  MRI head 06/22/2023. FINDINGS: CT HEAD FINDINGS Brain: No acute intracranial hemorrhage. No CT evidence of acute infarct. No edema, mass effect, or midline shift. The basilar cisterns are patent.  Ventricles: Prominence of the ventricles suggesting underlying parenchymal volume loss. Vascular: Atherosclerotic calcifications of the carotid siphons. No hyperdense vessel. Skull: No acute or aggressive finding. Orbits: Orbits are symmetric. Sinuses: Mild mucosal thickening in the ethmoid and maxillary sinuses. Additional mucosal thickening in the anterior aspect of the sphenoid sinuses. Other: Mastoid air cells are clear. CT CERVICAL SPINE FINDINGS Alignment: Cervical lordosis is maintained. No listhesis. No facet subluxation or dislocation. Skull base and vertebrae: No compression fracture or displaced fracture in the cervical spine. Partial fusion of the C4 and C5 vertebral bodies. Soft tissues and spinal canal: No prevertebral fluid or swelling. No visible canal hematoma. Disc levels: Intervertebral disc space narrowing at multiple levels. Uncovertebral hypertrophy throughout the cervical spine. Disc osteophyte complexes at C3-4 through C6-7 without high-grade osseous spinal canal stenosis. There is foraminal narrowing at multiple levels, greatest and severe on the right at C3-4 and bilaterally at C5-6 and C6-7. Upper chest: Negative. Other: None. IMPRESSION: No CT evidence of acute intracranial abnormality. No acute fracture or traumatic malalignment of the cervical spine. Degenerative changes of the cervical spine as above. Electronically Signed   By: Emily Filbert M.D.   On: 06/24/2023 20:07   DG Chest Port 1 View Result Date: 06/24/2023 CLINICAL DATA:  Altered mental status. EXAM: PORTABLE CHEST 1 VIEW COMPARISON:  05/05/2023 FINDINGS: Normal sized heart. Tortuous and partially calcified thoracic aorta. Slightly progressive linear and patchy density in the medial left lower lobe. The remainder of the lungs remain clear. Mild thoracic spine degenerative changes. Mild bilateral glenohumeral degenerative changes. IMPRESSION: Slightly progressive medial left lower lobe atelectasis. Associated pneumonia is  unlikely. Electronically Signed   By: Beckie Salts M.D.   On: 06/24/2023 19:24      Assessment/Plan   Principal Problem:   Generalized weakness   ***            ***                  ***              ***NOT severe; No to anti-virals  #) COVID-19 infection: diagnosis on the basis of: ***. Of note, presentation does not appear to be associated with acute hypoxic respiratory distress/failure, with patient maintaining O2 sats greater than 94% on room air. Additionally, CXR shows  ***. Overall, it does not appear that criteria are met at the present time for patient's COVID-19 infection to be considered severe in nature. Consequently, there does not appear to be an indication at this time for initiation of systemic corticosteroids per treatment guidance  recommendations from [facility]'s Covid Treatment Guidelines. Will trend inflammatory markers, as outlined below.   *** Additionally, as anti-viral medications, including remdesivir, Paxlovid, and molnupiravir, have showed limited benefit in the treatment of COVID-19 infection, including limited benefit in reducing the probability for progression of the severity associated with this infection, will refrain from initiation of any of these antiviral medications at this time.   OR  *** given that duration of patient's symptoms is greater than 5 days, criteria do not appear to be met at this time for consideration of initiation of remdesivir, Paxlovid, and molnupiravir. Consequently, will refrain from initiation of these antiviral medications at this time.     No known chronic underlying pulmonary pathology. No known history of underlying diabetes ***.    *** will initiate daily linagliptin as DPP-4 inhibitors have been shown to reduce mortality in patients with DM2 and a COVID-19.  ***procalcitonin was found to be non-elevated, which, in the context of the pro-inflammatory state associated with COVID-19,  provides a high degree of negative predictive value against the likelihood of any contribution from bacterial pneumonia.   Plan: Airborne and contact precautions. Monitor continuous pulse oximetry and monitor on telemetry. prn supplemental O2 to maintain O2 sats greater than or equal to 94%. Proning protocol initiated. PRN albuterol nebulizer. PRN acetaminophen for fever. Refraining from initiation of systemic corticosteroids and antivirals for now, as above. Check CRP now, with repeat level to be checked in the AM. Check serum magnesium and phosphorus levels. Check CMP and CBC in the morning. Flutter valve and incentive spirometry. check procalcitonin, which, if negative in the context of the pro inflammatory state associated with COVID-19, would provide a degree of negative predictive value that would further render the possibility of bacterial pneumonia to be less likely.    *** In setting of DM2, will start linagliptin 5 mg PO Qdaily for associated mortality benefit, as above.  *** Can consider Tocilizumab if worsening hypoxemia and CRP > 10, with independent indication if patient subsequently requires intubation.                  ***                  ***                  ***                  ***                   ***                  ***                  ***                  ***                  ***                 ***                ***  DVT prophylaxis: SCD's ***  Code Status: Full code*** Family Communication: none*** Disposition Plan: Per Rounding Team Consults called: none***;  Admission status: ***     I SPENT GREATER THAN 75 *** MINUTES IN CLINICAL CARE TIME/MEDICAL DECISION-MAKING IN COMPLETING THIS ADMISSION.      Angie Fava DO [facility]  From 7PM - 7AM   06/24/2023,  9:12 PM   ***

## 2023-06-24 NOTE — ED Triage Notes (Signed)
 PT BIB EMS for dizziness for about 4 days, with some intermittent confusion.  Was at [facility] on 06/22/2023, symptoms have not improved, AOX4 at this time, no pain, unable to stand unassisted.   152/80 HR 78 Resp 18 99% RA CBG 122 20 g L forearm

## 2023-06-25 DIAGNOSIS — U071 COVID-19: Secondary | ICD-10-CM | POA: Diagnosis not present

## 2023-06-25 DIAGNOSIS — I5032 Chronic diastolic (congestive) heart failure: Secondary | ICD-10-CM | POA: Diagnosis not present

## 2023-06-25 DIAGNOSIS — Z87898 Personal history of other specified conditions: Secondary | ICD-10-CM

## 2023-06-25 DIAGNOSIS — R531 Weakness: Secondary | ICD-10-CM | POA: Diagnosis not present

## 2023-06-25 DIAGNOSIS — D539 Nutritional anemia, unspecified: Secondary | ICD-10-CM | POA: Diagnosis present

## 2023-06-25 DIAGNOSIS — N1831 Chronic kidney disease, stage 3a: Secondary | ICD-10-CM | POA: Diagnosis not present

## 2023-06-25 DIAGNOSIS — J1282 Pneumonia due to coronavirus disease 2019: Secondary | ICD-10-CM | POA: Diagnosis present

## 2023-06-25 DIAGNOSIS — M6281 Muscle weakness (generalized): Secondary | ICD-10-CM | POA: Diagnosis not present

## 2023-06-25 LAB — CBC WITH DIFFERENTIAL/PLATELET
Abs Immature Granulocytes: 0.02 10*3/uL (ref 0.00–0.07)
Basophils Absolute: 0 10*3/uL (ref 0.0–0.1)
Basophils Relative: 1 %
Eosinophils Absolute: 0.4 10*3/uL (ref 0.0–0.5)
Eosinophils Relative: 8 %
HCT: 32.2 % — ABNORMAL LOW (ref 39.0–52.0)
Hemoglobin: 10.7 g/dL — ABNORMAL LOW (ref 13.0–17.0)
Immature Granulocytes: 0 %
Lymphocytes Relative: 28 %
Lymphs Abs: 1.4 10*3/uL (ref 0.7–4.0)
MCH: 33.6 pg (ref 26.0–34.0)
MCHC: 33.2 g/dL (ref 30.0–36.0)
MCV: 101.3 fL — ABNORMAL HIGH (ref 80.0–100.0)
Monocytes Absolute: 0.6 10*3/uL (ref 0.1–1.0)
Monocytes Relative: 11 %
Neutro Abs: 2.5 10*3/uL (ref 1.7–7.7)
Neutrophils Relative %: 52 %
Platelets: 145 10*3/uL — ABNORMAL LOW (ref 150–400)
RBC: 3.18 MIL/uL — ABNORMAL LOW (ref 4.22–5.81)
RDW: 14.8 % (ref 11.5–15.5)
WBC: 4.9 10*3/uL (ref 4.0–10.5)
nRBC: 0.4 % — ABNORMAL HIGH (ref 0.0–0.2)

## 2023-06-25 LAB — COMPREHENSIVE METABOLIC PANEL WITH GFR
ALT: 12 U/L (ref 0–44)
AST: 19 U/L (ref 15–41)
Albumin: 2.8 g/dL — ABNORMAL LOW (ref 3.5–5.0)
Alkaline Phosphatase: 49 U/L (ref 38–126)
Anion gap: 8 (ref 5–15)
BUN: 14 mg/dL (ref 8–23)
CO2: 26 mmol/L (ref 22–32)
Calcium: 8.9 mg/dL (ref 8.9–10.3)
Chloride: 106 mmol/L (ref 98–111)
Creatinine, Ser: 1.24 mg/dL (ref 0.61–1.24)
GFR, Estimated: 56 mL/min — ABNORMAL LOW (ref >60)
Glucose, Bld: 114 mg/dL — ABNORMAL HIGH (ref 70–99)
Potassium: 3.7 mmol/L (ref 3.5–5.1)
Sodium: 140 mmol/L (ref 135–145)
Total Bilirubin: 0.5 mg/dL (ref 0.0–1.2)
Total Protein: 5.4 g/dL — ABNORMAL LOW (ref 6.5–8.1)

## 2023-06-25 LAB — PROTIME-INR
INR: 1.2 (ref 0.8–1.2)
Prothrombin Time: 15 s (ref 11.4–15.2)

## 2023-06-25 LAB — MAGNESIUM: Magnesium: 1.8 mg/dL (ref 1.7–2.4)

## 2023-06-25 LAB — IRON AND TIBC
Iron: 51 ug/dL (ref 45–182)
Saturation Ratios: 16 % — ABNORMAL LOW (ref 17.9–39.5)
TIBC: 325 ug/dL (ref 250–450)
UIBC: 274 ug/dL

## 2023-06-25 LAB — C-REACTIVE PROTEIN: CRP: 0.6 mg/dL (ref <1.0)

## 2023-06-25 LAB — PHOSPHORUS: Phosphorus: 2.3 mg/dL — ABNORMAL LOW (ref 2.5–4.6)

## 2023-06-25 LAB — VITAMIN B12: Vitamin B-12: <50 pg/mL — ABNORMAL LOW (ref 180–914)

## 2023-06-25 LAB — FERRITIN: Ferritin: 34 ng/mL (ref 24–336)

## 2023-06-25 LAB — APTT: aPTT: 30 s (ref 24–36)

## 2023-06-25 LAB — BRAIN NATRIURETIC PEPTIDE: B Natriuretic Peptide: 113.7 pg/mL — ABNORMAL HIGH (ref 0.0–100.0)

## 2023-06-25 LAB — TSH: TSH: 3.813 u[IU]/mL (ref 0.350–4.500)

## 2023-06-25 LAB — FOLATE: Folate: 21 ng/mL (ref >5.9)

## 2023-06-25 MED ORDER — DIVALPROEX SODIUM 250 MG PO DR TAB
250.0000 mg | DELAYED_RELEASE_TABLET | Freq: Two times a day (BID) | ORAL | Status: DC
  Administered 2023-06-25 – 2023-06-26 (×3): 250 mg via ORAL
  Filled 2023-06-25 (×4): qty 1

## 2023-06-25 MED ORDER — FINASTERIDE 5 MG PO TABS
5.0000 mg | ORAL_TABLET | Freq: Every day | ORAL | Status: DC
  Administered 2023-06-25 – 2023-06-26 (×2): 5 mg via ORAL
  Filled 2023-06-25 (×2): qty 1

## 2023-06-25 MED ORDER — SIMVASTATIN 20 MG PO TABS
20.0000 mg | ORAL_TABLET | Freq: Every day | ORAL | Status: DC
Start: 2023-06-25 — End: 2023-06-26
  Administered 2023-06-25 – 2023-06-26 (×2): 20 mg via ORAL
  Filled 2023-06-25 (×2): qty 1

## 2023-06-25 MED ORDER — ASPIRIN 81 MG PO TBEC
81.0000 mg | DELAYED_RELEASE_TABLET | Freq: Every day | ORAL | Status: DC
Start: 2023-06-25 — End: 2023-06-26
  Administered 2023-06-25 – 2023-06-26 (×2): 81 mg via ORAL
  Filled 2023-06-25 (×2): qty 1

## 2023-06-25 NOTE — Care Management Obs Status (Signed)
 MEDICARE OBSERVATION STATUS NOTIFICATION   Patient Details  Name: CAMAR GUYTON MRN: [id number] Date of Birth: 12-May-1932   Medicare Observation Status Notification Given:  Yes    Tom-Johnson, Hershal Coria, RN 06/25/2023, 2:39 PM

## 2023-06-25 NOTE — ED Notes (Signed)
 Pt wanted to go the restroom. Nurse got a bedside toilet, pt was very unsteady on his feet and legs got extremely weak during the transfer. Nurse had to told pt up. PT back in bed call bell in reach.

## 2023-06-25 NOTE — Progress Notes (Signed)
 [facility] PROGRESS NOTE    Progress Note  Colin Lewis  ZOX:[id number] DOB: 01-08-1933 DOA: 06/24/2023 PCP: Ralene Ok, MD     Brief Narrative:   Colin Lewis is an 89 y.o. male past medical history significant for essential hypertension, hyperlipidemia, chronic diastolic dysfunction, chronic kidney disease 3 with a baseline creatinine around 1.5, comes into the hospital for generalized weakness that started 3 to 4 days prior to admission influenza PCR and RSV are negative, SARS-CoV-2 was positive.  Assessment/Plan:   Generalized weakness secondary to COVID-19 infection: Chest x-[place] showed right middle lobe infiltrate. CT of the head showed no acute intracranial abnormalities. CT C-spine showed no acute findings. CRP and procalcitonin were low yield PT OT has been consulted. TSH and B12 are pending Magnesium 1.8 Not a candidate for steroids at this time, saturation is 98% or greater.  Macrocytic anemia: Anemia panel is pending  Essential hypertension: Holding ACE inhibitor, blood pressure seems to be controlled. Continue to monitor.  HLD (hyperlipidemia) Continue statins.  History of seizures: Continue current drug regimen.  Chronic diastolic dysfunction: Continue current home medications.  CKD stage 3a, GFR 45-59 ml/min (HCC) Creatinine seems to be at baseline.    DVT prophylaxis: lovenox Family Communication:none Status is: Inpatient Remains inpatient appropriate because: Generalized weakness due to COVID-19 infection    Code Status:     Code Status Orders  (From admission, onward)           Start     Ordered   06/24/23 2105  Full code  Continuous       Question:  By:  Answer:  Consent: discussion documented in EHR   06/24/23 2104           Code Status History     Date Active Date Inactive Code Status Order ID Comments User Context   03/13/2020 0023 03/13/2020 2304 Full Code [id number]  Synetta Fail, MD ED         IV  Access:   Peripheral IV   Procedures and diagnostic studies:   CT HEAD WO CONTRAST ( ) Result Date: 06/24/2023 CLINICAL DATA:  Mental status change, dizziness, confusion. EXAM: CT HEAD WITHOUT CONTRAST CT CERVICAL SPINE WITHOUT CONTRAST TECHNIQUE: Multidetector CT imaging of the head and cervical spine was performed following the standard protocol without intravenous contrast. Multiplanar CT image reconstructions of the cervical spine were also generated. RADIATION DOSE REDUCTION: This exam was performed according to the departmental dose-optimization program which includes automated exposure control, adjustment of the mA and/or kV according to patient size and/or use of iterative reconstruction technique. COMPARISON:  MRI head 06/22/2023. FINDINGS: CT HEAD FINDINGS Brain: No acute intracranial hemorrhage. No CT evidence of acute infarct. No edema, mass effect, or midline shift. The basilar cisterns are patent. Ventricles: Prominence of the ventricles suggesting underlying parenchymal volume loss. Vascular: Atherosclerotic calcifications of the carotid siphons. No hyperdense vessel. Skull: No acute or aggressive finding. Orbits: Orbits are symmetric. Sinuses: Mild mucosal thickening in the ethmoid and maxillary sinuses. Additional mucosal thickening in the anterior aspect of the sphenoid sinuses. Other: Mastoid air cells are clear. CT CERVICAL SPINE FINDINGS Alignment: Cervical lordosis is maintained. No listhesis. No facet subluxation or dislocation. Skull base and vertebrae: No compression fracture or displaced fracture in the cervical spine. Partial fusion of the C4 and C5 vertebral bodies. Soft tissues and spinal canal: No prevertebral fluid or swelling. No visible canal hematoma. Disc levels: Intervertebral disc space narrowing at multiple levels. Uncovertebral hypertrophy throughout the cervical  spine. Disc osteophyte complexes at C3-4 through C6-7 without high-grade osseous spinal canal stenosis.  There is foraminal narrowing at multiple levels, greatest and severe on the right at C3-4 and bilaterally at C5-6 and C6-7. Upper chest: Negative. Other: None. IMPRESSION: No CT evidence of acute intracranial abnormality. No acute fracture or traumatic malalignment of the cervical spine. Degenerative changes of the cervical spine as above. Electronically Signed   By: Emily Filbert M.D.   On: 06/24/2023 20:07   CT Cervical Spine Wo Contrast Result Date: 06/24/2023 CLINICAL DATA:  Mental status change, dizziness, confusion. EXAM: CT HEAD WITHOUT CONTRAST CT CERVICAL SPINE WITHOUT CONTRAST TECHNIQUE: Multidetector CT imaging of the head and cervical spine was performed following the standard protocol without intravenous contrast. Multiplanar CT image reconstructions of the cervical spine were also generated. RADIATION DOSE REDUCTION: This exam was performed according to the departmental dose-optimization program which includes automated exposure control, adjustment of the mA and/or kV according to patient size and/or use of iterative reconstruction technique. COMPARISON:  MRI head 06/22/2023. FINDINGS: CT HEAD FINDINGS Brain: No acute intracranial hemorrhage. No CT evidence of acute infarct. No edema, mass effect, or midline shift. The basilar cisterns are patent. Ventricles: Prominence of the ventricles suggesting underlying parenchymal volume loss. Vascular: Atherosclerotic calcifications of the carotid siphons. No hyperdense vessel. Skull: No acute or aggressive finding. Orbits: Orbits are symmetric. Sinuses: Mild mucosal thickening in the ethmoid and maxillary sinuses. Additional mucosal thickening in the anterior aspect of the sphenoid sinuses. Other: Mastoid air cells are clear. CT CERVICAL SPINE FINDINGS Alignment: Cervical lordosis is maintained. No listhesis. No facet subluxation or dislocation. Skull base and vertebrae: No compression fracture or displaced fracture in the cervical spine. Partial fusion of  the C4 and C5 vertebral bodies. Soft tissues and spinal canal: No prevertebral fluid or swelling. No visible canal hematoma. Disc levels: Intervertebral disc space narrowing at multiple levels. Uncovertebral hypertrophy throughout the cervical spine. Disc osteophyte complexes at C3-4 through C6-7 without high-grade osseous spinal canal stenosis. There is foraminal narrowing at multiple levels, greatest and severe on the right at C3-4 and bilaterally at C5-6 and C6-7. Upper chest: Negative. Other: None. IMPRESSION: No CT evidence of acute intracranial abnormality. No acute fracture or traumatic malalignment of the cervical spine. Degenerative changes of the cervical spine as above. Electronically Signed   By: Emily Filbert M.D.   On: 06/24/2023 20:07   DG Chest Port 1 View Result Date: 06/24/2023 CLINICAL DATA:  Altered mental status. EXAM: PORTABLE CHEST 1 VIEW COMPARISON:  05/05/2023 FINDINGS: Normal sized heart. Tortuous and partially calcified thoracic aorta. Slightly progressive linear and patchy density in the medial left lower lobe. The remainder of the lungs remain clear. Mild thoracic spine degenerative changes. Mild bilateral glenohumeral degenerative changes. IMPRESSION: Slightly progressive medial left lower lobe atelectasis. Associated pneumonia is unlikely. Electronically Signed   By: Beckie Salts M.D.   On: 06/24/2023 19:24     Medical Consultants:   None.   Subjective:    Nolan Tuazon Portillo feels weak and tired  Objective:    Vitals:   06/24/23 1530 06/24/23 1535 06/25/23 0159 06/25/23 0400  BP: (!) 132/92  (!) 145/75 122/61  Pulse: 80  71 68  Resp: 18  (!) 22 20  Temp:  97.6 F (36.4 C) 97.7 F (36.5 C)   TempSrc:  Oral Oral   SpO2: 100%  98% 100%   SpO2: 100 %  No intake or output data in the 24 hours ending 06/25/23 1324  There were no vitals filed for this visit.  Exam: General exam: In no acute distress. Respiratory system: Good air movement and clear to  auscultation. Cardiovascular system: S1 & S2 heard, RRR. No JVD. Gastrointestinal system: Abdomen is nondistended, soft and nontender.  Extremities: No pedal edema. Skin: No rashes, lesions or ulcers  Data Reviewed:    Labs: Basic Metabolic Panel: Recent Labs  Lab 06/22/23 1322 06/24/23 1551 06/25/23 0507  NA 139 140 140  K 3.5 3.8 3.7  CL 105 105 106  CO2 26 27 26   GLUCOSE 106* 119* 114*  BUN 23 16 14   CREATININE 1.46* 1.31* 1.24  CALCIUM 9.3 9.2 8.9  MG  --  1.9 1.8  PHOS  --   --  2.3*   GFR Estimated Creatinine Clearance: 35.8 mL/min (by C-G formula based on SCr of 1.24 mg/dL). Liver Function Tests: Recent Labs  Lab 06/24/23 1551 06/25/23 0507  AST 25 19  ALT 12 12  ALKPHOS 60 49  BILITOT 0.7 0.5  PROT 6.1* 5.4*  ALBUMIN 3.1* 2.8*   No results for input(s): "LIPASE", "AMYLASE" in the last 168 hours. Recent Labs  Lab 06/24/23 1551  AMMONIA 15   Coagulation profile No results for input(s): "INR", "PROTIME" in the last 168 hours. COVID-19 Labs  Recent Labs    06/24/23 1551 06/25/23 0507  CRP 0.9 0.6    Lab Results  Component Value Date   SARSCOV2NAA POSITIVE (A) 06/24/2023   SARSCOV2NAA NEGATIVE 03/26/2023   SARSCOV2NAA NEGATIVE 03/12/2020   SARSCOV2NAA Not Detected 01/11/2019    CBC: Recent Labs  Lab 06/22/23 1322 06/24/23 1551 06/25/23 0507  WBC 4.3 5.4 4.9  NEUTROABS  --  3.4 2.5  HGB 12.0* 11.6* 10.7*  HCT 36.6* 36.2* 32.2*  MCV 101.9* 104.0* 101.3*  PLT 164 152 145*   Cardiac Enzymes: No results for input(s): "CKTOTAL", "CKMB", "CKMBINDEX", "TROPONINI" in the last 168 hours. BNP (last 3 results) No results for input(s): "PROBNP" in the last 8760 hours. CBG: No results for input(s): "GLUCAP" in the last 168 hours. D-Dimer: No results for input(s): "DDIMER" in the last 72 hours. Hgb A1c: No results for input(s): "HGBA1C" in the last 72 hours. Lipid Profile: No results for input(s): "CHOL", "HDL", "LDLCALC", "TRIG",  "CHOLHDL", "LDLDIRECT" in the last 72 hours. Thyroid function studies: No results for input(s): "TSH", "T4TOTAL", "T3FREE", "THYROIDAB" in the last 72 hours.  Invalid input(s): "FREET3" Anemia work up: No results for input(s): "VITAMINB12", "FOLATE", "FERRITIN", "TIBC", "IRON", "RETICCTPCT" in the last 72 hours. Sepsis Labs: Recent Labs  Lab 06/22/23 1322 06/24/23 1551 06/25/23 0507  PROCALCITON  --  <0.10  --   WBC 4.3 5.4 4.9   Microbiology Recent Results (from the past 240 hours)  Resp panel by RT-PCR (RSV, Flu A&B, Covid) Anterior Nasal Swab     Status: Abnormal   Collection Time: 06/24/23  4:45 PM   Specimen: Anterior Nasal Swab  Result Value Ref Range Status   SARS Coronavirus 2 by RT PCR POSITIVE (A) NEGATIVE Final   Influenza A by PCR NEGATIVE NEGATIVE Final   Influenza B by PCR NEGATIVE NEGATIVE Final    Comment: (NOTE) The Xpert Xpress SARS-CoV-2/FLU/RSV plus assay is intended as an aid in the diagnosis of influenza from Nasopharyngeal swab specimens and should not be used as a sole basis for treatment. Nasal washings and aspirates are unacceptable for Xpert Xpress SARS-CoV-2/FLU/RSV testing.  Fact Sheet for Patients: BloggerCourse.com  Fact Sheet for Healthcare Providers: SeriousBroker.it  This test  is not yet approved or cleared by the [place] and has been authorized for detection and/or diagnosis of SARS-CoV-2 by FDA under an Emergency Use Authorization (EUA). This EUA will remain in effect (meaning this test can be used) for the duration of the COVID-19 declaration under Section 564(b)(1) of the Act, 21 U.S.C. section 360bbb-3(b)(1), unless the authorization is terminated or revoked.     Resp Syncytial Virus by PCR NEGATIVE NEGATIVE Final    Comment: (NOTE) Fact Sheet for Patients: BloggerCourse.com  Fact Sheet for Healthcare  Providers: SeriousBroker.it  This test is not yet approved or cleared by the [place] FDA and has been authorized for detection and/or diagnosis of SARS-CoV-2 by FDA under an Emergency Use Authorization (EUA). This EUA will remain in effect (meaning this test can be used) for the duration of the COVID-19 declaration under Section 564(b)(1) of the Act, 21 U.S.C. section 360bbb-3(b)(1), unless the authorization is terminated or revoked.  Performed at [facility], [address] [place], [place] [address]      Medications:    aspirin EC  81 mg Oral Daily   divalproex  250 mg Oral BID   finasteride  5 mg Oral Daily   simvastatin  20 mg Oral Daily   Continuous Infusions:    LOS: 1 day   Marinda Elk  [facility]  06/25/2023, 6:53 AM

## 2023-06-25 NOTE — ED Notes (Signed)
 Inpatient provider at bedside.

## 2023-06-25 NOTE — Care Management CC44 (Signed)
 Condition Code 44 Documentation Completed  Patient Details  Name: Colin Lewis MRN: [id number] Date of Birth: February 28, 1933   Condition Code 44 given:  Yes Patient signature on Condition Code 44 notice:  Yes Documentation of 2 MD's agreement:  Yes Code 44 added to claim:  Yes    Tom-Johnson, Hershal Coria, RN 06/25/2023, 4:15 PM

## 2023-06-25 NOTE — Care Management Obs Status (Deleted)
 MEDICARE OBSERVATION STATUS NOTIFICATION   Patient Details  Name: Colin Lewis MRN: [id number] Date of Birth: September 23, 1932   Medicare Observation Status Notification Given:  Yes    Tom-Johnson, Hershal Coria, RN 06/25/2023, 4:15 PM

## 2023-06-25 NOTE — Evaluation (Signed)
 Occupational Therapy Evaluation Patient Details Name: Colin Lewis MRN: [id number] DOB: 1932-09-13 Today's Date: 06/25/2023   History of Present Illness   89 y.o. male presents to [facility] hospital on 06/24/2023 with generalized weakness. Pt is COVID+, chest xray with RML infiltrate. PMH includes HTN, HLD, CHF, CKD III.     Clinical Impressions Pt reports ind at baseline with ADLs and functional mobility, lives with spouse and son at home. Pt currently needing up to min A for ADLs, supervision for bed mobility and CGA-min A for transfers with RW. Pt needs min cues to keep RW close to body and for hand placement. Pt presenting with impairments listed below, will follow acutely. Recommend HHOT at d/c pending progression.     If plan is discharge home, recommend the following:   A little help with walking and/or transfers;A little help with bathing/dressing/bathroom;Assistance with cooking/housework;Assist for transportation     Functional Status Assessment   Patient has had a recent decline in their functional status and demonstrates the ability to make significant improvements in function in a reasonable and predictable amount of time.     Equipment Recommendations   None recommended by OT     Recommendations for Other Services   PT consult     Precautions/Restrictions   Precautions Precautions: Fall Recall of Precautions/Restrictions: Intact Precaution/Restrictions Comments: COVID Restrictions Weight Bearing Restrictions Per Provider Order: No     Mobility Bed Mobility Overal bed mobility: Needs Assistance         Sit to supine: Supervision        Transfers Overall transfer level: Needs assistance Equipment used: Rolling walker (2 wheels), None Transfers: Sit to/from Stand Sit to Stand: Min assist, Contact guard assist                  Balance Overall balance assessment: Needs assistance Sitting-balance support: No upper extremity supported, Feet  supported Sitting balance-Leahy Scale: Fair     Standing balance support: Bilateral upper extremity supported, Reliant on assistive device for balance Standing balance-Leahy Scale: Poor                             ADL either performed or assessed with clinical judgement   ADL Overall ADL's : Needs assistance/impaired Eating/Feeding: Set up;Sitting   Grooming: Set up;Standing   Upper Body Bathing: Minimal assistance;Sitting   Lower Body Bathing: Minimal assistance;Sitting/lateral leans;Sit to/from stand   Upper Body Dressing : Minimal assistance;Sitting   Lower Body Dressing: Minimal assistance;Sitting/lateral leans;Sit to/from stand   Toilet Transfer: Contact guard assist;Ambulation;Regular Toilet;Rolling walker (2 wheels)   Toileting- Clothing Manipulation and Hygiene: Contact guard assist       Functional mobility during ADLs: Contact guard assist;Rolling walker (2 wheels)       Vision   Vision Assessment?: No apparent visual deficits     Perception Perception: Not tested       Praxis Praxis: Not tested       Pertinent Vitals/Pain Pain Assessment Pain Assessment: No/denies pain     Extremity/Trunk Assessment Upper Extremity Assessment Upper Extremity Assessment: Generalized weakness   Lower Extremity Assessment Lower Extremity Assessment: Generalized weakness   Cervical / Trunk Assessment Cervical / Trunk Assessment: Normal   Communication Communication Communication: Impaired Factors Affecting Communication: Hearing impaired   Cognition Arousal: Alert Behavior During Therapy: WFL for tasks assessed/performed               OT - Cognition Comments: aware he  is at the hospital and that he has COVID                 Following commands: Intact       Cueing  General Comments   Cueing Techniques: Verbal cues  VSS on RA   Exercises     Shoulder Instructions      Home Living Family/patient expects to be discharged  to:: Private residence Living Arrangements: Spouse/significant other;Children (son) Available Help at Discharge: Family;Available 24 hours/day Type of Home: House Home Access: Stairs to enter [employer] of Steps: 1 Entrance Stairs-Rails: None Home Layout: One level     Bathroom Shower/Tub: Producer, television/film/video: Standard     Home Equipment: Agricultural consultant (2 wheels);Rollator (4 wheels);Grab bars - toilet;Grab bars - tub/shower;Shower seat          Prior Functioning/Environment Prior Level of Function : Independent/Modified Independent;Driving             Mobility Comments: ambulatory without DME ADLs Comments: ind, manages own meds\    OT Problem List: Decreased strength;Decreased range of motion;Decreased activity tolerance;Impaired balance (sitting and/or standing);Decreased cognition;Decreased safety awareness;Cardiopulmonary status limiting activity   OT Treatment/Interventions: Self-care/ADL training;Therapeutic exercise;Energy conservation;DME and/or AE instruction;Cognitive remediation/compensation;Therapeutic activities;Patient/family education;Balance training      OT Goals(Current goals can be found in the care plan section)   Acute Rehab OT Goals Patient Stated Goal: none stated OT Goal Formulation: With patient Time For Goal Achievement: 07/09/23 Potential to Achieve Goals: Good ADL Goals Pt Will Perform Upper Body Dressing: with modified independence;sitting Pt Will Perform Lower Body Dressing: with modified independence;sitting/lateral leans;sit to/from stand Pt Will Transfer to Toilet: with modified independence;ambulating;regular height toilet Pt Will Perform Tub/Shower Transfer: Tub transfer;Shower transfer;with modified independence;ambulating   OT Frequency:  Min 2X/week    Co-evaluation              AM-PAC OT "6 Clicks" Daily Activity     Outcome Measure Help from another person eating meals?: A Little Help  from another person taking care of personal grooming?: A Little Help from another person toileting, which includes using toliet, bedpan, or urinal?: A Little Help from another person bathing (including washing, rinsing, drying)?: A Little Help from another person to put on and taking off regular upper body clothing?: A Little Help from another person to put on and taking off regular lower body clothing?: A Little 6 Click Score: 18   End of Session Equipment Utilized During Treatment: Rolling walker (2 wheels) Nurse Communication: Mobility status  Activity Tolerance: Patient tolerated treatment well Patient left: in bed;with call bell/phone within reach;with bed alarm set  OT Visit Diagnosis: Unsteadiness on feet (R26.81);Other abnormalities of gait and mobility (R26.89);Muscle weakness (generalized) (M62.81)                Time: 2956-2130 OT Time Calculation (min): 21 min Charges:  OT General Charges $OT Visit: 1 Visit OT Evaluation $OT Eval Low Complexity: 1 Low  Carver Fila, OTD, OTR/L SecureChat Preferred Acute Rehab [phone]   Carver Fila Koonce 06/25/2023, 4:42 PM

## 2023-06-25 NOTE — TOC CM/SW Note (Signed)
 Transition of Care [facility]) - Inpatient Brief Assessment   Patient Details  Name: Colin Lewis MRN: [id number] Date of Birth: Jul 19, 1932  [facility] CM/SW Contact:    Tom-Johnson, Hershal Coria, RN Phone Number: 06/25/2023, 3:43 PM   Clinical Narrative:  Patient presented to the ED with Generalized Weakness and Dizziness. Found to be Covid+. Chest x-[place] showed Rt Middle Lobe Infiltrate.   CM spoke with patient at bedside and daughter, Arline Asp via Phone about discharge disposition. Patient is from home with wife and grandson. Has four supportive children. Independent with his care and still drives self. Has access to a cane, walker, rollator at home. Arline Asp states they do not have room for any more walkers at this time.  PCP is Ralene Ok, MD and uses [facility] on [address]   Home health recommended, Arline Asp states patient's wife is active with [facility] and would like to use their disciplines. CM called in referral to [facility] with acceptance voiced, info on AVS.    Patient not Medically ready for discharge.  CM will continue to follow as patient progresses with care towards discharge          Transition of Care Asessment: Insurance and Status: Insurance coverage has been reviewed Patient has primary care physician: Yes Home environment has been reviewed: Yes Prior level of function:: Independent Prior/Current Home Services: No current home services   Readmission risk has been reviewed: Yes Transition of care needs: transition of care needs identified, TOC will continue to follow

## 2023-06-25 NOTE — Evaluation (Signed)
 Physical Therapy Evaluation Patient Details Name: Colin Lewis MRN: [id number] DOB: 08-09-1932 Today's Date: 06/25/2023  History of Present Illness  89 y.o. male presents to [facility] hospital on 06/24/2023 with generalized weakness. Pt is COVID+, chest xray with RML infiltrate. PMH includes HTN, HLD, CHF, CKD III.  Clinical Impression  Pt presents to PT with deficits in strength, power, gait and balance. Pt demonstrates significant balance deviations when ambulating without support of DME, these are mitigated with use of RW during 2nd ambulation attempt. Pt reports improvement in strength since arrival to the hospital. The pt's spouse has Parkinson's disease and the pt reports only intermittent assistance available from his daughter is available otherwise. Pt will benefit from further acute PT services followed by home health to continue improving strength and balance. PT encourages the pt to utilize a RW for all out of bed activity to improve balance and to reduce falls risk.        If plan is discharge home, recommend the following: A little help with walking and/or transfers;A lot of help with bathing/dressing/bathroom;Assistance with cooking/housework;Assist for transportation;Help with stairs or ramp for entrance   Can travel by private vehicle        Equipment Recommendations Rolling walker (2 wheels) (pt's spouse utilizes the walker they own, he will need his own currently)  Recommendations for Other Services       Functional Status Assessment Patient has had a recent decline in their functional status and demonstrates the ability to make significant improvements in function in a reasonable and predictable amount of time.     Precautions / Restrictions Precautions Precautions: Fall Recall of Precautions/Restrictions: Intact Precaution/Restrictions Comments: COVID Restrictions Weight Bearing Restrictions Per Provider Order: No      Mobility  Bed Mobility Overal bed mobility: Needs  Assistance Bed Mobility: Supine to Sit, Sit to Supine     Supine to sit: Supervision Sit to supine: Supervision        Transfers Overall transfer level: Needs assistance Equipment used: Rolling walker (2 wheels), None Transfers: Sit to/from Stand Sit to Stand: Min assist, Contact guard assist           General transfer comment: Colin without DME due to posterior lean, CGA with RW    Ambulation/Gait Ambulation/Gait assistance: Min assist, Contact guard assist Gait Distance (Feet): 90 Feet (initial trial of 30' without DME, next trial for 66' with RW) Assistive device: Rolling walker (2 wheels), None Gait Pattern/deviations: Step-through pattern, Staggering left, Staggering right Gait velocity: reduced Gait velocity interpretation: <1.8 ft/sec, indicate of risk for recurrent falls   General Gait Details: pt with slowed step-through gait. During initial trial without DME the pt staggers multiple times laterally and requires PT assistance to prevent losses of balance. With RW pt demonstrates much improved stability, no physical assistance required from PT. PT does provide verbal cues for RW management when turning  Stairs            Wheelchair Mobility     Tilt Bed    Modified Rankin (Stroke Patients Only)       Balance Overall balance assessment: Needs assistance Sitting-balance support: No upper extremity supported, Feet supported Sitting balance-Leahy Scale: Fair     Standing balance support: Bilateral upper extremity supported, Reliant on assistive device for balance Standing balance-Leahy Scale: Poor                               Pertinent Vitals/Pain  Pain Assessment Pain Assessment: No/denies pain    Home Living Family/patient expects to be discharged to:: Private residence Living Arrangements: Spouse/significant other;Children Available Help at Discharge: Family;Available 24 hours/day Type of Home: House Home Access: Stairs to  enter Entrance Stairs-Rails: None Entrance Stairs-Number of Steps: 1   Home Layout: One level Home Equipment: Agricultural consultant (2 wheels);Rollator (4 wheels);Grab bars - toilet;Grab bars - tub/shower;Shower seat      Prior Function Prior Level of Function : Independent/Modified Independent;Driving             Mobility Comments: ambulatory without DME       Extremity/Trunk Assessment   Upper Extremity Assessment Upper Extremity Assessment: Generalized weakness    Lower Extremity Assessment Lower Extremity Assessment: Generalized weakness    Cervical / Trunk Assessment Cervical / Trunk Assessment: Normal  Communication   Communication Communication: Impaired Factors Affecting Communication: Hearing impaired    Cognition Arousal: Alert Behavior During Therapy: WFL for tasks assessed/performed   PT - Cognitive impairments: No apparent impairments                         Following commands: Intact       Cueing Cueing Techniques: Verbal cues     General Comments General comments (skin integrity, edema, etc.): VSS on RA    Exercises     Assessment/Plan    PT Assessment Patient needs continued PT services  PT Problem List Decreased strength;Decreased balance;Decreased activity tolerance;Decreased mobility;Decreased knowledge of use of DME;Decreased safety awareness;Decreased knowledge of precautions       PT Treatment Interventions DME instruction;Gait training;Stair training;Functional mobility training;Therapeutic activities;Therapeutic exercise;Balance training;Neuromuscular re-education;Patient/family education    PT Goals (Current goals can be found in the Care Plan section)  Acute Rehab PT Goals Patient Stated Goal: to return home PT Goal Formulation: With patient Time For Goal Achievement: 07/09/23 Potential to Achieve Goals: Good    Frequency Min 3X/week     Co-evaluation               AM-PAC PT "6 Clicks" Mobility  Outcome  Measure Help needed turning from your back to your side while in a flat bed without using bedrails?: None Help needed moving from lying on your back to sitting on the side of a flat bed without using bedrails?: A Little Help needed moving to and from a bed to a chair (including a wheelchair)?: A Little Help needed standing up from a chair using your arms (e.g., wheelchair or bedside chair)?: A Little Help needed to walk in hospital room?: A Little Help needed climbing 3-5 steps with a railing? : A Lot 6 Click Score: 18    End of Session Equipment Utilized During Treatment: Gait belt Activity Tolerance: Patient tolerated treatment well Patient left: in bed;with call bell/phone within reach Nurse Communication: Mobility status PT Visit Diagnosis: Other abnormalities of gait and mobility (R26.89);Muscle weakness (generalized) (M62.81)    Time: 9147-8295 PT Time Calculation (min) (ACUTE ONLY): 22 min   Charges:   PT Evaluation $PT Eval Low Complexity: 1 Low   PT General Charges $$ ACUTE PT VISIT: 1 Visit         Arlyss Gandy, PT, DPT Acute Rehabilitation Office [phone]   Arlyss Gandy 06/25/2023, 10:47 AM

## 2023-06-26 DIAGNOSIS — R531 Weakness: Secondary | ICD-10-CM | POA: Diagnosis not present

## 2023-06-26 DIAGNOSIS — U071 COVID-19: Secondary | ICD-10-CM | POA: Diagnosis not present

## 2023-06-26 DIAGNOSIS — M6281 Muscle weakness (generalized): Secondary | ICD-10-CM | POA: Diagnosis not present

## 2023-06-26 DIAGNOSIS — N1831 Chronic kidney disease, stage 3a: Secondary | ICD-10-CM | POA: Diagnosis not present

## 2023-06-26 MED ORDER — VITAMIN B-12 1000 MCG PO TABS: 1000.0000 ug | ORAL_TABLET | Freq: Every day | ORAL | 0 refills | Status: AC

## 2023-06-26 MED ORDER — CYANOCOBALAMIN 1000 MCG/ML IJ SOLN
1000.0000 ug | Freq: Every day | INTRAMUSCULAR | Status: DC
  Administered 2023-06-26: 1000 ug via SUBCUTANEOUS
  Filled 2023-06-26: qty 1

## 2023-06-26 NOTE — TOC Transition Note (Signed)
 Transition of Care [facility]) - Discharge Note   Patient Details  Name: JARROD MCENERY MRN: [id number] Date of Birth: 1932/06/25  [facility] CM/SW Contact:  Tom-Johnson, Hershal Coria, RN Phone Number: 06/26/2023, 10:21 AM   Clinical Narrative:     Patient is scheduled for discharge today.  Readmission Risk Assessment done. Home health info, Outpatient f/u, hospital f/u and discharge instructions on AVS. Daughter, Arline Asp to transport at discharge.  No further TOC needs noted     Final next level of care: Home w Home Health Services Barriers to Discharge: Barriers Resolved   Patient Goals and CMS Choice Patient states their goals for this hospitalization and ongoing recovery are:: To return home CMS Medicare.gov Compare Post Acute Care list provided to:: Patient Choice offered to / list presented to : Patient, Adult Children Arline Asp)      Discharge Placement                Patient to be transferred to facility by: Daughter Name of family member notified: [facility]    Discharge Plan and Services Additional resources added to the After Visit Summary for                  DME Arranged: N/A DME Agency: NA       HH Arranged: PT, RN, OT HH Agency: [facility] Date HH Agency Contacted: 06/25/23 Time HH Agency Contacted: 1356 Representative spoke with at [facility] Agency: Haywood Lasso  Social Drivers of Health (SDOH) Interventions SDOH Screenings   Food Insecurity: No Food Insecurity (06/25/2023)  Housing: Low Risk  (06/25/2023)  Transportation Needs: No Transportation Needs (06/25/2023)  Utilities: Not At Risk (06/25/2023)  Social Connections: Moderately Integrated (06/25/2023)  Tobacco Use: Low Risk  (06/24/2023)     Readmission Risk Interventions    06/25/2023    3:42 PM  Readmission Risk Prevention Plan  Transportation Screening Complete  PCP or Specialist Appt within 5-7 Days Complete  Home Care Screening Complete  Medication Review (RN CM) Referral to Pharmacy

## 2023-06-26 NOTE — Discharge Summary (Signed)
 Physician Discharge Summary  Colin Lewis WJX:[id number] DOB: [birth year] DOA: 06/24/2023  PCP: Ralene Ok, MD  Admit date: 06/24/2023 Discharge date: 06/26/2023  Admitted From: Home Disposition:  Home  Recommendations for Outpatient Follow-up:  Follow up with PCP in 1-2 weeks Please obtain BMP/CBC in one week   Home Health:Yes Equipment/Devices:None  Discharge Condition:Stable CODE STATUS:Full Diet recommendation: Heart Healthy  Brief/Interim Summary: 89 y.o. male past medical history significant for essential hypertension, hyperlipidemia, chronic diastolic dysfunction, chronic kidney disease 3 with a baseline creatinine around 1.5, comes into the hospital for generalized weakness that started 3 to 4 days prior to admission influenza PCR and RSV are negative, SARS-CoV-2 was positive.   Discharge Diagnoses:  Principal Problem:   Generalized weakness Active Problems:   Essential hypertension   HLD (hyperlipidemia)   CKD stage 3a, GFR 45-59 ml/min (HCC)   Chronic diastolic CHF (congestive heart failure) (HCC)   COVID-19 virus infection   Macrocytic anemia   History of seizures   COVID-19   Pneumonia due to COVID-19 virus  Generalized weakness due to COVID-19: CT of the head showed no acute findings, CT of the spine showed no acute findings. Procalcitonin and CRP were low yield. Physical therapy was consulted, he will go home with home health PT. Her saturations remained greater than 98% on room air.  Macrocytic anemia Ferritin of 34, B12 was less than 50. Started on B12 repletion, follow-up PCP check levels in 6 months.  Essential hypertension: Antihypertensive medications were held as blood pressure was borderline, but after IV fluid gestation his blood pressure increased, he will continue his home regimen no changes made.  Hyperlipidemia Continue statins.  History of seizures: Continue Depakote no changes made.  Chronic diastolic dysfunction: No changes made to  his medication.  Chronic kidney see stage IIIb: His creatinine remained at baseline.  Vitamin B12 deficiency: He was started on subcutaneous repletion here in the ED as his B12 level was less than 50. Continue oral repletion as an outpatient follow-up with PCP. Discharge Instructions  Discharge Instructions     Diet - low sodium heart healthy   Complete by: As directed    Increase activity slowly   Complete by: As directed       Allergies as of 06/26/2023   No Known Allergies      Medication List     TAKE these medications    albuterol 108 (90 Base) MCG/ACT inhaler Commonly known as: VENTOLIN HFA Inhale 2 puffs into the lungs every 6 (six) hours as needed for wheezing or shortness of breath.   aspirin EC 81 MG tablet Take 81 mg by mouth daily. Swallow whole.   cholecalciferol 25 MCG (1000 UNIT) tablet Commonly known as: VITAMIN D3 Take 1,000 Units by mouth daily.   cyanocobalamin 1000 MCG tablet Commonly known as: VITAMIN B12 Take 1 tablet (1,000 mcg total) by mouth daily.   divalproex 250 MG DR tablet Commonly known as: DEPAKOTE Take 1 tablet (250 mg total) by mouth 2 (two) times daily.   finasteride 5 MG tablet Commonly known as: PROSCAR Take 1 tablet by mouth daily.   meclizine 12.5 MG tablet Commonly known as: ANTIVERT Take 1 tablet (12.5 mg total) by mouth 3 (three) times daily as needed for dizziness.   Probiotic-Prebiotic 1-250 BILLION-MG Caps Take 1 capsule by mouth daily.   simvastatin 20 MG tablet Commonly known as: ZOCOR Take 1 tablet by mouth daily.   telmisartan 40 MG tablet Commonly known as: MICARDIS Take 40 mg  by mouth daily.        Follow-up Information     [facility] Follow up.   Specialty: Home Health Services Why: Someone will call you to schedule first home visit. Contact information: [address] [place] [address] [phone]                No Known  Allergies  Consultations: None   Procedures/Studies: CT HEAD WO CONTRAST ( ) Result Date: 06/24/2023 CLINICAL DATA:  Mental status change, dizziness, confusion. EXAM: CT HEAD WITHOUT CONTRAST CT CERVICAL SPINE WITHOUT CONTRAST TECHNIQUE: Multidetector CT imaging of the head and cervical spine was performed following the standard protocol without intravenous contrast. Multiplanar CT image reconstructions of the cervical spine were also generated. RADIATION DOSE REDUCTION: This exam was performed according to the departmental dose-optimization program which includes automated exposure control, adjustment of the mA and/or kV according to patient size and/or use of iterative reconstruction technique. COMPARISON:  MRI head 06/22/2023. FINDINGS: CT HEAD FINDINGS Brain: No acute intracranial hemorrhage. No CT evidence of acute infarct. No edema, mass effect, or midline shift. The basilar cisterns are patent. Ventricles: Prominence of the ventricles suggesting underlying parenchymal volume loss. Vascular: Atherosclerotic calcifications of the carotid siphons. No hyperdense vessel. Skull: No acute or aggressive finding. Orbits: Orbits are symmetric. Sinuses: Mild mucosal thickening in the ethmoid and maxillary sinuses. Additional mucosal thickening in the anterior aspect of the sphenoid sinuses. Other: Mastoid air cells are clear. CT CERVICAL SPINE FINDINGS Alignment: Cervical lordosis is maintained. No listhesis. No facet subluxation or dislocation. Skull base and vertebrae: No compression fracture or displaced fracture in the cervical spine. Partial fusion of the C4 and C5 vertebral bodies. Soft tissues and spinal canal: No prevertebral fluid or swelling. No visible canal hematoma. Disc levels: Intervertebral disc space narrowing at multiple levels. Uncovertebral hypertrophy throughout the cervical spine. Disc osteophyte complexes at C3-4 through C6-7 without high-grade osseous spinal canal stenosis. There is  foraminal narrowing at multiple levels, greatest and severe on the right at C3-4 and bilaterally at C5-6 and C6-7. Upper chest: Negative. Other: None. IMPRESSION: No CT evidence of acute intracranial abnormality. No acute fracture or traumatic malalignment of the cervical spine. Degenerative changes of the cervical spine as above. Electronically Signed   By: Emily Filbert M.D.   On: 06/24/2023 20:07   CT Cervical Spine Wo Contrast Result Date: 06/24/2023 CLINICAL DATA:  Mental status change, dizziness, confusion. EXAM: CT HEAD WITHOUT CONTRAST CT CERVICAL SPINE WITHOUT CONTRAST TECHNIQUE: Multidetector CT imaging of the head and cervical spine was performed following the standard protocol without intravenous contrast. Multiplanar CT image reconstructions of the cervical spine were also generated. RADIATION DOSE REDUCTION: This exam was performed according to the departmental dose-optimization program which includes automated exposure control, adjustment of the mA and/or kV according to patient size and/or use of iterative reconstruction technique. COMPARISON:  MRI head 06/22/2023. FINDINGS: CT HEAD FINDINGS Brain: No acute intracranial hemorrhage. No CT evidence of acute infarct. No edema, mass effect, or midline shift. The basilar cisterns are patent. Ventricles: Prominence of the ventricles suggesting underlying parenchymal volume loss. Vascular: Atherosclerotic calcifications of the carotid siphons. No hyperdense vessel. Skull: No acute or aggressive finding. Orbits: Orbits are symmetric. Sinuses: Mild mucosal thickening in the ethmoid and maxillary sinuses. Additional mucosal thickening in the anterior aspect of the sphenoid sinuses. Other: Mastoid air cells are clear. CT CERVICAL SPINE FINDINGS Alignment: Cervical lordosis is maintained. No listhesis. No facet subluxation or  dislocation. Skull base and vertebrae: No compression fracture or displaced fracture in the cervical spine. Partial fusion of the C4 and  C5 vertebral bodies. Soft tissues and spinal canal: No prevertebral fluid or swelling. No visible canal hematoma. Disc levels: Intervertebral disc space narrowing at multiple levels. Uncovertebral hypertrophy throughout the cervical spine. Disc osteophyte complexes at C3-4 through C6-7 without high-grade osseous spinal canal stenosis. There is foraminal narrowing at multiple levels, greatest and severe on the right at C3-4 and bilaterally at C5-6 and C6-7. Upper chest: Negative. Other: None. IMPRESSION: No CT evidence of acute intracranial abnormality. No acute fracture or traumatic malalignment of the cervical spine. Degenerative changes of the cervical spine as above. Electronically Signed   By: Emily Filbert M.D.   On: 06/24/2023 20:07   DG Chest Port 1 View Result Date: 06/24/2023 CLINICAL DATA:  Altered mental status. EXAM: PORTABLE CHEST 1 VIEW COMPARISON:  05/05/2023 FINDINGS: Normal sized heart. Tortuous and partially calcified thoracic aorta. Slightly progressive linear and patchy density in the medial left lower lobe. The remainder of the lungs remain clear. Mild thoracic spine degenerative changes. Mild bilateral glenohumeral degenerative changes. IMPRESSION: Slightly progressive medial left lower lobe atelectasis. Associated pneumonia is unlikely. Electronically Signed   By: Beckie Salts M.D.   On: 06/24/2023 19:24   MR BRAIN WO CONTRAST Result Date: 06/22/2023 CLINICAL DATA:  Neuro deficit, acute, stroke suspected. Dizziness. Bilateral leg weakness. Urinary incontinence. EXAM: MRI HEAD WITHOUT CONTRAST TECHNIQUE: Multiplanar, multiecho pulse sequences of the brain and surrounding structures were obtained without intravenous contrast. COMPARISON:  MR head without contrast. CT angio head and neck 03/13/2020. FINDINGS: Brain: No acute infarct, hemorrhage, or mass lesion is present. A remote white matter infarct in the right cerebellum is again noted. Mild periventricular and subcortical white matter  changes are otherwise similar the prior study, likely within normal limits for age. The ventricles are of normal size. No significant extraaxial fluid collection is present. Deep brain nuclei are within normal limits. The brainstem and cerebellum are within normal limits. The internal auditory canals are within normal limits. Midline structures are within normal limits. Vascular: Flow is present in the major intracranial arteries. Skull and upper cervical spine: The craniocervical junction is normal. Upper cervical spine is within normal limits. Marrow signal is unremarkable. Sinuses/Orbits: Mild mucosal thickening is present in the maxillary sinuses and ethmoid air cells bilaterally. Mild mucosal thickening is present left sphenoid sinus. No fluid levels are present. Mastoid air cells are clear. Bilateral lens replacements are noted. Globes and orbits are otherwise unremarkable. IMPRESSION: 1. No acute intracranial abnormality or significant interval change. 2. 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 3. Mild periventricular and subcortical white matter changes are otherwise similar the prior study, likely within normal limits for age. 4. Mild paranasal sinus disease. Electronically Signed   By: Marin Roberts M.D.   On: 06/22/2023 16:56   (Echo, Carotid, EGD, Colonoscopy, ERCP)    Subjective: No complaints  Discharge Exam: Vitals:   06/26/23 0529 06/26/23 0844  BP: (!) 145/68 (!) 161/77  Pulse: 76 85  Resp: 19   Temp: 98 F (36.7 C) 97.8 F (36.6 C)  SpO2: 99% 98%   Vitals:   06/25/23 2052 06/26/23 0247 06/26/23 0529 06/26/23 0844  BP: 122/74  (!) 145/68 (!) 161/77  Pulse: 77  76 85  Resp: 19  19   Temp: 98.5 F (36.9 C)  98 F (36.7 C) 97.8 F (36.6 C)  TempSrc: Oral  Oral Oral  SpO2: 96%  99% 98%  Weight:  68.6 kg    Height:        General: Pt is alert, awake, not in acute distress Cardiovascular: RRR, S1/S2 +, no rubs, no gallops Respiratory: CTA  bilaterally, no wheezing, no rhonchi Abdominal: Soft, NT, ND, bowel sounds + Extremities: no edema, no cyanosis    The results of significant diagnostics from this hospitalization (including imaging, microbiology, ancillary and laboratory) are listed below for reference.     Microbiology: Recent Results (from the past 240 hours)  Resp panel by RT-PCR (RSV, Flu A&B, Covid) Anterior Nasal Swab     Status: Abnormal   Collection Time: 06/24/23  4:45 PM   Specimen: Anterior Nasal Swab  Result Value Ref Range Status   SARS Coronavirus 2 by RT PCR POSITIVE (A) NEGATIVE Final   Influenza A by PCR NEGATIVE NEGATIVE Final   Influenza B by PCR NEGATIVE NEGATIVE Final    Comment: (NOTE) The Xpert Xpress SARS-CoV-2/FLU/RSV plus assay is intended as an aid in the diagnosis of influenza from Nasopharyngeal swab specimens and should not be used as a sole basis for treatment. Nasal washings and aspirates are unacceptable for Xpert Xpress SARS-CoV-2/FLU/RSV testing.  Fact Sheet for Patients: BloggerCourse.com  Fact Sheet for Healthcare Providers: SeriousBroker.it  This test is not yet approved or cleared by the [place] FDA and has been authorized for detection and/or diagnosis of SARS-CoV-2 by FDA under an Emergency Use Authorization (EUA). This EUA will remain in effect (meaning this test can be used) for the duration of the COVID-19 declaration under Section 564(b)(1) of the Act, 21 U.S.C. section 360bbb-3(b)(1), unless the authorization is terminated or revoked.     Resp Syncytial Virus by PCR NEGATIVE NEGATIVE Final    Comment: (NOTE) Fact Sheet for Patients: BloggerCourse.com  Fact Sheet for Healthcare Providers: SeriousBroker.it  This test is not yet approved or cleared by the [place] FDA and has been authorized for detection and/or diagnosis of SARS-CoV-2 by FDA under  an Emergency Use Authorization (EUA). This EUA will remain in effect (meaning this test can be used) for the duration of the COVID-19 declaration under Section 564(b)(1) of the Act, 21 U.S.C. section 360bbb-3(b)(1), unless the authorization is terminated or revoked.  Performed at [facility], [address] [place], [place] [address]      Labs: BNP (last 3 results) Recent Labs    06/24/23 1551 06/25/23 0700  BNP 148.7* 113.7*   Basic Metabolic Panel: Recent Labs  Lab 06/22/23 1322 06/24/23 1551 06/25/23 0507  NA 139 140 140  K 3.5 3.8 3.7  CL 105 105 106  CO2 26 27 26   GLUCOSE 106* 119* 114*  BUN 23 16 14   CREATININE 1.46* 1.31* 1.24  CALCIUM 9.3 9.2 8.9  MG  --  1.9 1.8  PHOS  --   --  2.3*   Liver Function Tests: Recent Labs  Lab 06/24/23 1551 06/25/23 0507  AST 25 19  ALT 12 12  ALKPHOS 60 49  BILITOT 0.7 0.5  PROT 6.1* 5.4*  ALBUMIN 3.1* 2.8*   No results for input(s): "LIPASE", "AMYLASE" in the last 168 hours. Recent Labs  Lab 06/24/23 1551  AMMONIA 15   CBC: Recent Labs  Lab 06/22/23 1322 06/24/23 1551 06/25/23 0507  WBC 4.3 5.4 4.9  NEUTROABS  --  3.4 2.5  HGB 12.0* 11.6* 10.7*  HCT 36.6* 36.2* 32.2*  MCV 101.9* 104.0* 101.3*  PLT 164 152 145*  Cardiac Enzymes: No results for input(s): "CKTOTAL", "CKMB", "CKMBINDEX", "TROPONINI" in the last 168 hours. BNP: Invalid input(s): "POCBNP" CBG: No results for input(s): "GLUCAP" in the last 168 hours. D-Dimer No results for input(s): "DDIMER" in the last 72 hours. Hgb A1c No results for input(s): "HGBA1C" in the last 72 hours. Lipid Profile No results for input(s): "CHOL", "HDL", "LDLCALC", "TRIG", "CHOLHDL", "LDLDIRECT" in the last 72 hours. Thyroid function studies Recent Labs    06/25/23 0700  TSH 3.813   Anemia work up Recent Labs    06/25/23 0700  VITAMINB12 <50*  FOLATE 21.0  FERRITIN 34  TIBC 325  IRON 51   Urinalysis    Component Value Date/Time    COLORURINE YELLOW 06/24/2023 1533   APPEARANCEUR CLEAR 06/24/2023 1533   LABSPEC 1.010 06/24/2023 1533   PHURINE 6.0 06/24/2023 1533   GLUCOSEU NEGATIVE 06/24/2023 1533   HGBUR NEGATIVE 06/24/2023 1533   BILIRUBINUR NEGATIVE 06/24/2023 1533   KETONESUR NEGATIVE 06/24/2023 1533   PROTEINUR NEGATIVE 06/24/2023 1533   UROBILINOGEN 1.0 04/10/2007 0630   NITRITE NEGATIVE 06/24/2023 1533   LEUKOCYTESUR NEGATIVE 06/24/2023 1533   Sepsis Labs Recent Labs  Lab 06/22/23 1322 06/24/23 1551 06/25/23 0507  WBC 4.3 5.4 4.9   Microbiology Recent Results (from the past 240 hours)  Resp panel by RT-PCR (RSV, Flu A&B, Covid) Anterior Nasal Swab     Status: Abnormal   Collection Time: 06/24/23  4:45 PM   Specimen: Anterior Nasal Swab  Result Value Ref Range Status   SARS Coronavirus 2 by RT PCR POSITIVE (A) NEGATIVE Final   Influenza A by PCR NEGATIVE NEGATIVE Final   Influenza B by PCR NEGATIVE NEGATIVE Final    Comment: (NOTE) The Xpert Xpress SARS-CoV-2/FLU/RSV plus assay is intended as an aid in the diagnosis of influenza from Nasopharyngeal swab specimens and should not be used as a sole basis for treatment. Nasal washings and aspirates are unacceptable for Xpert Xpress SARS-CoV-2/FLU/RSV testing.  Fact Sheet for Patients: BloggerCourse.com  Fact Sheet for Healthcare Providers: SeriousBroker.it  This test is not yet approved or cleared by the [place] FDA and has been authorized for detection and/or diagnosis of SARS-CoV-2 by FDA under an Emergency Use Authorization (EUA). This EUA will remain in effect (meaning this test can be used) for the duration of the COVID-19 declaration under Section 564(b)(1) of the Act, 21 U.S.C. section 360bbb-3(b)(1), unless the authorization is terminated or revoked.     Resp Syncytial Virus by PCR NEGATIVE NEGATIVE Final    Comment: (NOTE) Fact Sheet for  Patients: BloggerCourse.com  Fact Sheet for Healthcare Providers: SeriousBroker.it  This test is not yet approved or cleared by the [place] FDA and has been authorized for detection and/or diagnosis of SARS-CoV-2 by FDA under an Emergency Use Authorization (EUA). This EUA will remain in effect (meaning this test can be used) for the duration of the COVID-19 declaration under Section 564(b)(1) of the Act, 21 U.S.C. section 360bbb-3(b)(1), unless the authorization is terminated or revoked.  Performed at [facility], [address], [place], [place] [address]      Time coordinating discharge: Over 35 minutes  SIGNED:   Marinda Elk, MD  [facility] 06/26/2023, 10:00 AM Pager   If 7PM-7AM, please contact night-coverage [email or web address] Password [email or web address]

## 2023-06-26 NOTE — Progress Notes (Signed)
 Physical Therapy Treatment Patient Details Lewis: Colin Lewis MRN: [id number] DOB: May 27, 1932 Today's Date: 06/26/2023   History of Present Illness 89 y.o. male presents to [facility] on 06/24/2023 with generalized weakness. Pt is COVID+, chest xray with RML infiltrate. PMH includes HTN, HLD, CHF, CKD III.    PT Comments  Pt received sitting EOB. Reports he is feeling much better but R knee still feels a little weak, on MMT no significant difference between R and L but does seem mildly unstable on that side with gait. Pt is at high risk for falls right now and does not have a 2 wheel RW (one at home is wife's which she uses). 2 wheel RW recommend for gait at all times at this point. SPO2 96% with gait, HR 91 bpm. Pt educated on RLE strengthening exercises and rec HHPT at d/c. PT will continue to follow.     If plan is discharge home, recommend the following: A little help with walking and/or transfers;A lot of help with bathing/dressing/bathroom;Assistance with cooking/housework;Assist for transportation;Help with stairs or ramp for entrance   Can travel by private vehicle        Equipment Recommendations  Rolling walker (2 wheels) (pt's spouse utilizes the walker they own, he will need his own currently)    Recommendations for Other Services       Precautions / Restrictions Precautions Precautions: Fall Recall of Precautions/Restrictions: Intact Precaution/Restrictions Comments: COVID Restrictions Weight Bearing Restrictions Per Provider Order: No     Mobility  Bed Mobility               General bed mobility comments: pt received sitting EOB    Transfers Overall transfer level: Needs assistance Equipment used: Rolling walker (2 wheels) Transfers: Sit to/from Stand Sit to Stand: Supervision           General transfer comment: supervision for safety, vc's for hand placement. Braces LE's against back of bed    Ambulation/Gait Ambulation/Gait assistance:  Supervision Gait Distance (Feet): 200 Feet Assistive device: Rolling walker (2 wheels) Gait Pattern/deviations: Step-through pattern, Drifts right/left Gait velocity: reduced Gait velocity interpretation: 1.31 - 2.62 ft/sec, indicative of limited community ambulator   General Gait Details: pt begins steady with RW but last 30' increased drift even with RW. Encouraged him to use RW consistently at home while he recovers for fall prevention   Stairs             Wheelchair Mobility     Tilt Bed    Modified Rankin (Stroke Patients Only)       Balance Overall balance assessment: Needs assistance Sitting-balance support: No upper extremity supported, Feet supported Sitting balance-Leahy Scale: Fair     Standing balance support: Bilateral upper extremity supported, Reliant on assistive device for balance Standing balance-Leahy Scale: Poor Standing balance comment: unsteady withou UE support                            Communication Communication Communication: Impaired Factors Affecting Communication: Hearing impaired  Cognition Arousal: Alert Behavior During Therapy: WFL for tasks assessed/performed   PT - Cognitive impairments: No apparent impairments                         Following commands: Intact      Cueing Cueing Techniques: Verbal cues  Exercises Other Exercises Other Exercises: R knee ext with 30 sec hold Other Exercises: Seated R hip flex  x15 Other Exercises: mini squats Other Exercises: R SL stance x15 secs with RW    General Comments General comments (skin integrity, edema, etc.): HR 91 bpm, SPO2 96% on RA.      Pertinent Vitals/Pain Pain Assessment Pain Assessment: No/denies pain    Home Living                          Prior Function            PT Goals (current goals can now be found in the care plan section) Acute Rehab PT Goals Patient Stated Goal: to return home PT Goal Formulation: With  patient Time For Goal Achievement: 07/09/23 Potential to Achieve Goals: Good Progress towards PT goals: Progressing toward goals    Frequency    Min 3X/week      PT Plan      Lewis-evaluation              AM-PAC PT "6 Clicks" Mobility   Outcome Measure  Help needed turning from your back to your side while in a flat bed without using bedrails?: None Help needed moving from lying on your back to sitting on the side of a flat bed without using bedrails?: A Little Help needed moving to and from a bed to a chair (including a wheelchair)?: A Little Help needed standing up from a chair using your arms (e.g., wheelchair or bedside chair)?: A Little Help needed to walk in hospital room?: A Little Help needed climbing 3-5 steps with a railing? : A Lot 6 Click Score: 18    End of Session   Activity Tolerance: Patient tolerated treatment well Patient left: in bed;with call bell/phone within reach Nurse Communication: Mobility status PT Visit Diagnosis: Other abnormalities of gait and mobility (R26.89);Muscle weakness (generalized) (M62.81)     Time: 8657-8469 PT Time Calculation (min) (ACUTE ONLY): 21 min  Charges:    $Gait Training: 8-22 mins PT General Charges $$ ACUTE PT VISIT: 1 Visit                     Colin Lewis, PT  Acute Rehab Services Secure chat preferred Office [phone]    Colin Lewis 06/26/2023, 10:54 AM

## 2023-11-10 ENCOUNTER — Encounter (INDEPENDENT_AMBULATORY_CARE_PROVIDER_SITE_OTHER): Payer: Medicare Other | Admitting: Ophthalmology

## 2023-11-11 ENCOUNTER — Encounter (INDEPENDENT_AMBULATORY_CARE_PROVIDER_SITE_OTHER): Admitting: Ophthalmology

## 2023-11-11 DIAGNOSIS — I10 Essential (primary) hypertension: Secondary | ICD-10-CM | POA: Diagnosis not present

## 2023-11-11 DIAGNOSIS — H43813 Vitreous degeneration, bilateral: Secondary | ICD-10-CM

## 2023-11-11 DIAGNOSIS — H35033 Hypertensive retinopathy, bilateral: Secondary | ICD-10-CM

## 2023-11-11 DIAGNOSIS — D3131 Benign neoplasm of right choroid: Secondary | ICD-10-CM | POA: Diagnosis not present

## 2024-05-12 ENCOUNTER — Ambulatory Visit: Payer: Medicare Other | Admitting: Neurology

## 2024-11-10 ENCOUNTER — Encounter (INDEPENDENT_AMBULATORY_CARE_PROVIDER_SITE_OTHER): Admitting: Ophthalmology
